# Patient Record
Sex: Male | Born: 1985
Health system: Southern US, Community
[De-identification: ages and names within clinical notes are randomized; demographics above are authoritative.]

## PROBLEM LIST (undated history)

## (undated) DIAGNOSIS — N12 Tubulo-interstitial nephritis, not specified as acute or chronic: Secondary | ICD-10-CM

## (undated) DIAGNOSIS — Z6830 Body mass index (BMI) 30.0-30.9, adult: Secondary | ICD-10-CM

## (undated) HISTORY — DX: Tubulo-interstitial nephritis, not specified as acute or chronic: N12

## (undated) HISTORY — PX: ELBOW ARTHROSCOPY: SHX614

## (undated) HISTORY — DX: Body mass index (BMI) 30.0-30.9, adult: Z68.30

## (undated) HISTORY — PX: WISDOM TOOTH EXTRACTION: SHX21

---
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
## 2016-07-22 DIAGNOSIS — N12 Tubulo-interstitial nephritis, not specified as acute or chronic: Secondary | ICD-10-CM

## 2016-07-22 HISTORY — DX: Tubulo-interstitial nephritis, not specified as acute or chronic: N12

## 2017-08-19 ENCOUNTER — Encounter: Payer: Self-pay | Admitting: Family Medicine

## 2017-08-26 ENCOUNTER — Encounter: Payer: Self-pay | Admitting: Family Medicine

## 2017-12-08 ENCOUNTER — Encounter: Payer: Self-pay | Admitting: Family Medicine

## 2017-12-08 ENCOUNTER — Ambulatory Visit (INDEPENDENT_AMBULATORY_CARE_PROVIDER_SITE_OTHER): Payer: 59 | Admitting: Family Medicine

## 2017-12-08 DIAGNOSIS — J302 Other seasonal allergic rhinitis: Secondary | ICD-10-CM

## 2017-12-08 DIAGNOSIS — R21 Rash and other nonspecific skin eruption: Secondary | ICD-10-CM | POA: Diagnosis not present

## 2017-12-08 DIAGNOSIS — Z87448 Personal history of other diseases of urinary system: Secondary | ICD-10-CM | POA: Diagnosis not present

## 2017-12-08 LAB — BASIC METABOLIC PANEL
BUN: 13 mg/dL (ref 6–23)
CO2: 26 mEq/L (ref 19–32)
Calcium: 9.4 mg/dL (ref 8.4–10.5)
Chloride: 104 mEq/L (ref 96–112)
Creatinine, Ser: 1.1 mg/dL (ref 0.40–1.50)
GFR: 81.43 mL/min (ref >60.00)
Glucose, Bld: 92 mg/dL (ref 70–99)
Potassium: 4 mEq/L (ref 3.5–5.1)
Sodium: 138 mEq/L (ref 135–145)

## 2017-12-08 NOTE — Assessment & Plan Note (Signed)
Continue over-the-counter Zyrtec and Flonase.

## 2017-12-08 NOTE — Assessment & Plan Note (Signed)
Check BMET 

## 2017-12-08 NOTE — Progress Notes (Signed)
Subjective:  Juan Allen is a 32 y.o. male who presents today with a chief complaint of history of interstitial nephritis and to establish care.   HPI:  History of Interstitial nephritis Pt diagnosed with interstitial nephritis about 4 months ago. Was initally found to have elevated creatinine in setting of bilateral flank pain. Pt was admitted and had significant work up including biopsy, ultrasonagraphy, and blood work which was essentially negative except for small eosinophils on his biopsy. No clear precipitant, however pt was noted to take a small dose of ibuprofen and acycylovir just prior to his presentation. Pt was placed on 4 weeks of steroids and has had near resolution of symptoms. Requests CMET today.   Seasonal allergies, chronic problem Several year history. Takes zyrtec as needed. Has been on allergy shots in the past and tolerated well. Occasionally takes flonase.  Rash, chronic problem Several year history. Located on hands and feet. "Blister like". No other symptoms. No pruritis or pain. Has tried topical steroid a couple times which has not helped. Symptoms are stable.   ROS: Per HPI, otherwise a complete review of systems was negative.   PMH:  The following were reviewed and entered/updated in epic: Past Medical History:  Diagnosis Date  . Interstitial nephritis    Patient Active Problem List   Diagnosis Date Noted  . History of interstitial nephritis 12/08/2017  . Seasonal allergies 12/08/2017  . Rash 12/08/2017   Past Surgical History:  Procedure Laterality Date  . ELBOW ARTHROSCOPY Left    medial epidondyle injury  . SURGERY SCROTAL / TESTICULAR  2001   scrotal laceration needed 150 stitches    Family History  Problem Relation Age of Onset  . Depression Mother   . Cancer Maternal Grandfather        bladder cancer  . Diabetes Maternal Grandfather   . Heart disease Maternal Grandfather   . Dementia Paternal Grandmother   . Hypertension  Paternal Grandfather     Medications- reviewed and updated No current outpatient medications on file.   No current facility-administered medications for this visit.    Allergies-reviewed and updated Allergies  Allergen Reactions  . Nsaids     Social History   Socioeconomic History  . Marital status: Married    Spouse name: Not on file  . Number of children: Not on file  . Years of education: Not on file  . Highest education level: Not on file  Occupational History  . Not on file  Social Needs  . Financial resource strain: Not on file  . Food insecurity:    Worry: Not on file    Inability: Not on file  . Transportation needs:    Medical: Not on file    Non-medical: Not on file  Tobacco Use  . Smoking status: Former Smoker    Types: Cigarettes  . Smokeless tobacco: Never Used  Substance and Sexual Activity  . Alcohol use: Yes    Alcohol/week: 3.6 oz    Types: 6 Standard drinks or equivalent per week  . Drug use: Never  . Sexual activity: Yes    Partners: Female  Lifestyle  . Physical activity:    Days per week: Not on file    Minutes per session: Not on file  . Stress: Not on file  Relationships  . Social connections:    Talks on phone: Not on file    Gets together: Not on file    Attends religious service: Not on file  Active member of club or organization: Not on file    Attends meetings of clubs or organizations: Not on file    Relationship status: Not on file  Other Topics Concern  . Not on file  Social History Narrative  . Not on file   Objective:  Physical Exam: BP 104/64   Pulse 67   Temp 98.7 F (37.1 C) (Oral)   Resp 14   Ht 6' (1.829 m)   Wt 213 lb (96.6 kg)   SpO2 96%   BMI 28.89 kg/m   Gen: NAD, resting comfortably CV: RRR with no murmurs appreciated Pulm: NWOB, CTAB with no crackles, wheezes, or rhonchi GI: Normal bowel sounds present. Soft, Nontender, Nondistended. MSK: No edema, cyanosis, or clubbing noted Skin:  Erythematous, macerated, vesicular lesion noted on lateral aspect of soles and palms. Neuro: Grossly normal, moves all extremities Psych: Normal affect and thought content  Assessment/Plan:  History of interstitial nephritis Check BMET.  Seasonal allergies Continue over-the-counter Zyrtec and Flonase.  Rash Possibly consistent with dyshidrotic eczema.  Discussed treatment options including topical steroids.  Patient has some at home that he will possibly try.  Preventive healthcare Obtain records from previous PCP.  Follow-up sooner for CPE in about 6 months.  [name]. Jerline Pain, MD 12/08/2017 2:00 PM

## 2017-12-08 NOTE — Progress Notes (Signed)
Please let pt know that his blood work was all normal.  [name]. Jerline Pain, MD 12/08/2017 4:47 PM

## 2017-12-08 NOTE — Assessment & Plan Note (Signed)
Possibly consistent with dyshidrotic eczema.  Discussed treatment options including topical steroids.  Patient has some at home that he will possibly try.

## 2017-12-08 NOTE — Patient Instructions (Signed)
We will check blood work.  I would like to get your prior records.  Let me know if you need a referral to the nephrologist or allergist.  Please try using steroid cream on your foot rash.  Come back to see me in 6 months for your physical, or sooner as needed.  Take care, Dr Jerline Pain

## 2018-04-01 ENCOUNTER — Other Ambulatory Visit: Payer: Self-pay | Admitting: Family Medicine

## 2018-04-01 DIAGNOSIS — J302 Other seasonal allergic rhinitis: Secondary | ICD-10-CM

## 2018-04-09 ENCOUNTER — Encounter: Payer: Self-pay | Admitting: Sports Medicine

## 2018-04-09 ENCOUNTER — Ambulatory Visit (INDEPENDENT_AMBULATORY_CARE_PROVIDER_SITE_OTHER): Payer: 59 | Admitting: Sports Medicine

## 2018-04-09 VITALS — BP 120/82 | HR 62 | Ht 72.0 in | Wt 205.8 lb

## 2018-04-09 DIAGNOSIS — M6752 Plica syndrome, left knee: Secondary | ICD-10-CM | POA: Diagnosis not present

## 2018-04-09 MED ORDER — DICLOFENAC SODIUM 2 % TD SOLN: 1.0000 "application " | Freq: Two times a day (BID) | TRANSDERMAL | 0 refills | Status: AC

## 2018-04-09 MED ORDER — DICLOFENAC SODIUM 2 % TD SOLN: 1.0000 "application " | Freq: Two times a day (BID) | TRANSDERMAL | 2 refills | Status: DC

## 2018-04-09 NOTE — Patient Instructions (Addendum)
Also check out [employer]" which is a program developed by Dr. Minerva Ends.   There are links to a couple of his YouTube Videos below and I would like to see performing one of his videos 5-6 days per week.    A good intro video is: "Independence from Pain 7-minute Video" - [email or web address]   Exercises that focus more on the neck are as below: Dr. Archie Balboa with [employer] teaching neck and shoulder details Part 1 - https://youtu.be/[email or web address] Part 2 Dr. Archie Balboa with [facility] quick routine to practice daily - https://youtu.be/[email or web address]  Do not try to attempt the entire video when first beginning.    Try breaking of each exercise that he goes into shorter segments.  Otherwise if they perform an exercise for 45 seconds, start with 15 seconds and rest and then resume when they begin the new activity.  If you work your way up to being able to do these videos without having to stop, I expect you will see significant improvements in your pain.  If you enjoy his videos and would like to find out more you can look on his website: [email or web address]  He has a workout streaming option as well as a DVD set available for purchase.  Amazon has the best price for his DVDs.     Pennsaid instructions: You have been given a sample/prescription for Pennsaid, a topical medication.  Please use this twice daily for the next 2-4 weeks.   You are to apply this gel to your injured body part twice daily (morning and evening).   A little goes a long way so you can use about a pea-sized amount for each area.   Spread this small amount over the area into a thin film and let it dry.   Be sure that you do not rub the gel into your skin for more than 10 or 15 seconds otherwise it can irritate you skin.    Once you apply the gel, please do not put any other lotion or clothing in contact with that area for 30 minutes to allow the gel to absorb into your  skin.   Some people are sensitive to the medication and can develop a sunburn-like rash.  If you have only mild symptoms it is okay to continue to use the medication but if you have any breakdown of your skin you should discontinue its use and please let us know.   If you have been written a prescription for Pennsaid, you will receive a pump bottle of this topical gel through a mail order pharmacy.  The instructions on the bottle will say to apply two pumps twice a day which may be too much gel for your particular area so use the pea-sized amount as your guide.

## 2018-04-09 NOTE — Progress Notes (Signed)
Juan Allen. Juan Allen, Juan Allen  Juan Allen - 32 y.o. male MRN [id number]  Date of birth: [birth year]  Visit Date: 04/09/2018  PCP: Juan Barrack, MD   Referred by: Juan Barrack, MD   Scribe(s) for today's visit: Juan Allen, LAT, ATC  SUBJECTIVE:  Juan Allen is here for No chief complaint on file.    HPI: His L lateral knee pain symptoms INITIALLY: Began about a month ago w/ no known MOI Described as moderate burning pain that starts at one Allen and radiates to the top and bottom of the patella w/ irritating activities, nonradiating Worsened with loaded knee flexion Improved with rest Additional associated symptoms include: no L knee swelling, no mechanical symptoms and no N/t noted in his L LE    At this time symptoms show no change compared to onset. He has been doing nothing for his symptoms in terms of treatment.  REVIEW OF SYSTEMS: Denies night time disturbances. Denies fevers, chills, or night sweats. Denies unexplained weight loss. Denies personal history of cancer. Denies changes in bowel or bladder habits. Denies recent unreported falls. Denies new or worsening dyspnea or wheezing. Denies headaches or dizziness.  Denies numbness, tingling or weakness  In the extremities.  Denies dizziness or presyncopal episodes Denies lower extremity edema    HISTORY:  Prior history reviewed and updated per electronic medical record.  Social History   Occupational History  . Not on file  Tobacco Use  . Smoking status: Former Smoker    Types: Cigarettes  . Smokeless tobacco: Never Used  Substance and Sexual Activity  . Alcohol use: Yes    Alcohol/week: 6.0 standard drinks    Types: 6 Standard drinks or equivalent per week  . Drug use: Never  . Sexual activity: Yes    Partners: Female   Social History   Social History Narrative  . Not on file    Past Medical History:    Diagnosis Date  . Interstitial nephritis    Past Surgical History:  Procedure Laterality Date  . ELBOW ARTHROSCOPY Left    medial epidondyle injury  . SURGERY SCROTAL / TESTICULAR  2001   scrotal laceration needed 150 stitches   family history includes Cancer in his maternal grandfather; Dementia in his paternal grandmother; Depression in his mother; Diabetes in his maternal grandfather; Heart disease in his maternal grandfather; Hypertension in his paternal grandfather.  DATA OBTAINED & REVIEWED:  No results for input(s): HGBA1C, LABURIC, CREATINE in the last 8760 hours. .   OBJECTIVE:  VS:  HT:6' (182.9 cm)   WT:205 lb 12.8 oz (93.4 kg)  BMI:27.91    BP:120/82  HR:62bpm  TEMP: ( )  RESP:97 %   PHYSICAL EXAM: CONSTITUTIONAL: Well-developed, Well-nourished and In no acute distress PSYCHIATRIC: Alert & appropriately interactive. and Not depressed or anxious appearing. RESPIRATORY: No increased work of breathing and Trachea Midline EYES: Pupils are equal., EOM intact without nystagmus. and No scleral icterus.  VASCULAR EXAM: Warm and well perfused NEURO: unremarkable  MSK Exam:  Left Knee  Alignment & Contours: normal Skin: Small amount of scaling and dry skin over the anterior aspect of the knee Effusion: none   Generalized Synovitis: none Knee Tenderness: No focal bony TTP Gait: normal Patellar grind produces: No pain and No crepitation  He does have a slightly palpable prominence of the anterior lateral plica and with flexion extension this does reproduce the snapping  over the plica with no significant pain.    RANGE OF MOTION & STRENGTH  EXTENSION: Normal  with no pain.   Strength: Normal FLEXION: Normal with no pain.   Strength: Normal HIP ABDUCTION: Strength: 4/5 Recruitment pattern: TFL predominant   LIGAMENTOUS TESTING  Varus & Valgus Strain: stable to testing Anterior & Posterior Drawer: stable to testing Lachman's: stable to testing   SPECIALITY  TESTING:  Patellar Apprehension: normal, no pain J Sign: normal, no pain Mcmurray's: normal, no pain Thessaly: normal, no pain    ASSESSMENT   1. Plica of knee, left     PLAN:  Pertinent additional documentation may be included in corresponding procedure notes, imaging studies, problem based documentation and patient instructions.  Procedures:  . None  Medications:  No orders of the defined types were placed in this encounter.  Discussion/Instructions: No problem-specific Assessment & Plan notes found for this encounter.  . Symptoms are consistent with lateral plica inflammation.  Conservative measures and topical anti-inflammatory provided. . Links to [employer] provided today per Patient Instructions.  These exercises were developed by Juan Allen, DC with a strong emphasis on core neuromuscular reducation and postural realignment through body-weight exercises. . Discussed red flag symptoms that warrant earlier emergent evaluation and patient voices understanding. . Activity modifications and the importance of avoiding exacerbating activities (limiting pain to no more than a 4 / 10 during or following activity) recommended and discussed.  Follow-up:  . No follow-ups on file.   . If any lack of improvement consider: further diagnostic evaluation with X-rays and MSK ultrasound if persistent symptoms  .      CMA/ATC served as Education administrator during this visit. History, Physical, and Plan performed by medical provider. Documentation and orders reviewed and attested to.      Juan Allen, [facility] Physician

## 2018-05-08 ENCOUNTER — Encounter: Payer: Self-pay | Admitting: Family Medicine

## 2018-05-12 ENCOUNTER — Ambulatory Visit (INDEPENDENT_AMBULATORY_CARE_PROVIDER_SITE_OTHER): Payer: 59 | Admitting: Orthopaedic Surgery

## 2018-05-12 ENCOUNTER — Ambulatory Visit (INDEPENDENT_AMBULATORY_CARE_PROVIDER_SITE_OTHER): Payer: 59

## 2018-05-12 ENCOUNTER — Encounter (INDEPENDENT_AMBULATORY_CARE_PROVIDER_SITE_OTHER): Payer: Self-pay | Admitting: Orthopaedic Surgery

## 2018-05-12 DIAGNOSIS — M25562 Pain in left knee: Secondary | ICD-10-CM | POA: Diagnosis not present

## 2018-05-12 NOTE — Progress Notes (Signed)
Office Visit Note   Patient: Juan Allen           Date of Birth: 1985/08/08           MRN: [id number] Visit Date: 05/12/2018              Requested by: Vivi Barrack, MD [address] [place], [place] [address] PCP: Vivi Barrack, MD   Assessment & Plan: Visit Diagnoses:  1. Acute pain of left knee     Plan: Since the last few days she is been feeling better overall regular continue just watch his knee.  He will still work on activity modification.  However my next step would be an intra-articular steroid injection to treat inflammation going on likely intra-articular.  This could both be therapeutic and diagnostic injection.  We would even consider Neurontin at bedtime.  All questions concerns were answered and addressed.  If he decides to have an injection he will give [place] a call and we can bring him in easily for the left knee steroid injection.  Follow-Up Instructions: Return if symptoms worsen or fail to improve.   Orders:  Orders Placed This Encounter  Procedures  . XR Knee 1-2 Views Left   No orders of the defined types were placed in this encounter.     Procedures: No procedures performed   Clinical Data: No additional findings.   Subjective: Chief Complaint  Patient presents with  . Left Knee - Pain  The patient is very pleasant and active 32 year old gentleman who comes in for evaluation treatment of acute left knee pain is been going on for about 6 weeks now.  There is actually a burning sensation in his knee as well when kneeling and is mainly lateral at the lateral joint line.  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  His wife is a physician is with him today.  He cannot take anti-inflammatories orally due to kidney issues.  He has been trying a topical anti-inflammatory but is not helped significantly.  He said the last few days has been doing much better overall but is been staying off of his knee.  He denies any  locking catching and denies any swelling.  He has not injured this knee before either.  HPI  Review of Systems He currently denies any systemic illnesses  Objective: Vital Signs: There were no vitals taken for this visit.  Physical Exam He is alert and oriented x3 in no acute distress Ortho Exam Examination of his left knee shows no effusion.  His range of motion is full.  His alignment is normal.  His Lachman's and McMurray's exams are negative.  He has slight lateral joint line tenderness.  I cannot palpate any deficits or plica around the knee.  His skin is intact as well. Specialty Comments:  No specialty comments available.  Imaging: Xr Knee 1-2 Views Left  Result Date: 05/12/2018 2 views of the left knee show normal alignment with well-maintained joint space no acute findings.  There is no effusion.    PMFS History: Patient Active Problem List   Diagnosis Date Noted  . Plica of knee, left 03/70/4888  . History of interstitial nephritis 12/08/2017  . Seasonal allergies 12/08/2017  . Rash 12/08/2017   Past Medical History:  Diagnosis Date  . Interstitial nephritis     Family History  Problem Relation Age of Onset  . Depression Mother   . Cancer Maternal Grandfather  bladder cancer  . Diabetes Maternal Grandfather   . Heart disease Maternal Grandfather   . Dementia Paternal Grandmother   . Hypertension Paternal Grandfather     Past Surgical History:  Procedure Laterality Date  . ELBOW ARTHROSCOPY Left    medial epidondyle injury  . SURGERY SCROTAL / TESTICULAR  2001   scrotal laceration needed 150 stitches   Social History   Occupational History  . Not on file  Tobacco Use  . Smoking status: Former Smoker    Types: Cigarettes  . Smokeless tobacco: Never Used  Substance and Sexual Activity  . Alcohol use: Yes    Alcohol/week: 6.0 standard drinks    Types: 6 Standard drinks or equivalent per week  . Drug use: Never  . Sexual activity: Yes     Partners: Female

## 2018-05-18 ENCOUNTER — Other Ambulatory Visit: Payer: Self-pay

## 2018-05-18 DIAGNOSIS — J301 Allergic rhinitis due to pollen: Secondary | ICD-10-CM | POA: Diagnosis not present

## 2018-05-18 DIAGNOSIS — J3081 Allergic rhinitis due to animal (cat) (dog) hair and dander: Secondary | ICD-10-CM | POA: Diagnosis not present

## 2018-05-18 DIAGNOSIS — Z23 Encounter for immunization: Secondary | ICD-10-CM

## 2018-05-18 DIAGNOSIS — J3089 Other allergic rhinitis: Secondary | ICD-10-CM | POA: Diagnosis not present

## 2018-05-22 DIAGNOSIS — J3081 Allergic rhinitis due to animal (cat) (dog) hair and dander: Secondary | ICD-10-CM | POA: Diagnosis not present

## 2018-05-22 DIAGNOSIS — J301 Allergic rhinitis due to pollen: Secondary | ICD-10-CM | POA: Diagnosis not present

## 2018-05-25 DIAGNOSIS — J3089 Other allergic rhinitis: Secondary | ICD-10-CM | POA: Diagnosis not present

## 2018-07-16 DIAGNOSIS — J3081 Allergic rhinitis due to animal (cat) (dog) hair and dander: Secondary | ICD-10-CM | POA: Diagnosis not present

## 2018-07-16 DIAGNOSIS — J301 Allergic rhinitis due to pollen: Secondary | ICD-10-CM | POA: Diagnosis not present

## 2018-07-16 DIAGNOSIS — J3089 Other allergic rhinitis: Secondary | ICD-10-CM | POA: Diagnosis not present

## 2018-07-28 DIAGNOSIS — J3081 Allergic rhinitis due to animal (cat) (dog) hair and dander: Secondary | ICD-10-CM | POA: Diagnosis not present

## 2018-07-28 DIAGNOSIS — J301 Allergic rhinitis due to pollen: Secondary | ICD-10-CM | POA: Diagnosis not present

## 2018-07-28 DIAGNOSIS — J3089 Other allergic rhinitis: Secondary | ICD-10-CM | POA: Diagnosis not present

## 2018-07-31 DIAGNOSIS — J3089 Other allergic rhinitis: Secondary | ICD-10-CM | POA: Diagnosis not present

## 2018-07-31 DIAGNOSIS — J301 Allergic rhinitis due to pollen: Secondary | ICD-10-CM | POA: Diagnosis not present

## 2018-07-31 DIAGNOSIS — J3081 Allergic rhinitis due to animal (cat) (dog) hair and dander: Secondary | ICD-10-CM | POA: Diagnosis not present

## 2018-08-07 ENCOUNTER — Ambulatory Visit (INDEPENDENT_AMBULATORY_CARE_PROVIDER_SITE_OTHER): Payer: 59 | Admitting: Family Medicine

## 2018-08-07 ENCOUNTER — Encounter: Payer: Self-pay | Admitting: Family Medicine

## 2018-08-07 VITALS — BP 122/72 | HR 65 | Temp 98.4°F | Ht 72.0 in | Wt 212.2 lb

## 2018-08-07 DIAGNOSIS — Z0001 Encounter for general adult medical examination with abnormal findings: Secondary | ICD-10-CM | POA: Diagnosis not present

## 2018-08-07 DIAGNOSIS — Z87448 Personal history of other diseases of urinary system: Secondary | ICD-10-CM | POA: Diagnosis not present

## 2018-08-07 DIAGNOSIS — Z6828 Body mass index (BMI) 28.0-28.9, adult: Secondary | ICD-10-CM | POA: Diagnosis not present

## 2018-08-07 DIAGNOSIS — Z13220 Encounter for screening for lipoid disorders: Secondary | ICD-10-CM | POA: Diagnosis not present

## 2018-08-07 LAB — COMPREHENSIVE METABOLIC PANEL
ALT: 23 U/L (ref 0–53)
AST: 18 U/L (ref 0–37)
Albumin: 4.6 g/dL (ref 3.5–5.2)
Alkaline Phosphatase: 89 U/L (ref 39–117)
BUN: 11 mg/dL (ref 6–23)
CO2: 28 mEq/L (ref 19–32)
Calcium: 9.8 mg/dL (ref 8.4–10.5)
Chloride: 102 mEq/L (ref 96–112)
Creatinine, Ser: 1 mg/dL (ref 0.40–1.50)
GFR: 85.18 mL/min (ref >60.00)
Glucose, Bld: 89 mg/dL (ref 70–99)
Potassium: 4.5 mEq/L (ref 3.5–5.1)
Sodium: 138 mEq/L (ref 135–145)
Total Bilirubin: 0.8 mg/dL (ref 0.2–1.2)
Total Protein: 7 g/dL (ref 6.0–8.3)

## 2018-08-07 LAB — LIPID PANEL
Cholesterol: 184 mg/dL (ref 0–200)
HDL: 40.9 mg/dL (ref >39.00)
LDL Cholesterol: 117 mg/dL — ABNORMAL HIGH (ref 0–99)
NonHDL: 142.76
Total CHOL/HDL Ratio: 4
Triglycerides: 128 mg/dL (ref 0.0–149.0)
VLDL: 25.6 mg/dL (ref 0.0–40.0)

## 2018-08-07 LAB — CBC
HCT: 46.6 % (ref 39.0–52.0)
Hemoglobin: 16.5 g/dL (ref 13.0–17.0)
MCHC: 35.5 g/dL (ref 30.0–36.0)
MCV: 90.5 fl (ref 78.0–100.0)
Platelets: 324 10*3/uL (ref 150.0–400.0)
RBC: 5.15 Mil/uL (ref 4.22–5.81)
RDW: 11.6 % (ref 11.5–15.5)
WBC: 5 10*3/uL (ref 4.0–10.5)

## 2018-08-07 LAB — TSH: TSH: 0.95 u[IU]/mL (ref 0.35–4.50)

## 2018-08-07 NOTE — Patient Instructions (Signed)
It was very nice to see you today!   Eat at least 3 REAL meals and 1-2 snacks per day.  Aim for no more than 5 hours between eating.  Eat breakfast within one hour of getting up.    Obtain twice as many fruits/vegetables as protein or carbohydrate foods for both lunch and dinner.   Cut down on sweet beverages. This includes juice, soda, and sweet tea.    Exercise at least 150 minutes every week.    Take care, Dr Jerline Pain   Preventive Care 18-39 Years, Male Preventive care refers to lifestyle choices and visits with your health care provider that can promote health and wellness. What does preventive care include?   A yearly physical exam. This is also called an annual well check.  Dental exams once or twice a year.  Routine eye exams. Ask your health care provider how often you should have your eyes checked.  Personal lifestyle choices, including: ? Daily care of your teeth and gums. ? Regular physical activity. ? Eating a healthy diet. ? Avoiding tobacco and drug use. ? Limiting alcohol use. ? Practicing safe sex. What happens during an annual well check? The services and screenings done by your health care provider during your annual well check will depend on your age, overall health, lifestyle risk factors, and family history of disease. Counseling Your health care provider may ask you questions about your:  Alcohol use.  Tobacco use.  Drug use.  Emotional well-being.  Home and relationship well-being.  Sexual activity.  Eating habits.  Work and work Statistician. Screening You may have the following tests or measurements:  Height, weight, and BMI.  Blood pressure.  Lipid and cholesterol levels. These may be checked every 5 years starting at age 47.  Diabetes screening. This is done by checking your blood sugar (glucose) after you have not eaten for a while (fasting).  Skin check.  Hepatitis C blood test.  Hepatitis B blood test.  Sexually  transmitted disease (STD) testing. Discuss your test results, treatment options, and if necessary, the need for more tests with your health care provider. Vaccines Your health care provider may recommend certain vaccines, such as:  Influenza vaccine. This is recommended every year.  Tetanus, diphtheria, and acellular pertussis (Tdap, Td) vaccine. You may need a Td booster every 10 years.  Varicella vaccine. You may need this if you have not been vaccinated.  HPV vaccine. If you are 64 or younger, you may need three doses over 6 months.  Measles, mumps, and rubella (MMR) vaccine. You may need at least one dose of MMR.You may also need a second dose.  Pneumococcal 13-valent conjugate (PCV13) vaccine. You may need this if you have certain conditions and have not been vaccinated.  Pneumococcal polysaccharide (PPSV23) vaccine. You may need one or two doses if you smoke cigarettes or if you have certain conditions.  Meningococcal vaccine. One dose is recommended if you are age 9-21 years and a first-year college student living in a residence hall, or if you have one of several medical conditions. You may also need additional booster doses.  Hepatitis A vaccine. You may need this if you have certain conditions or if you travel or work in places where you may be exposed to hepatitis A.  Hepatitis B vaccine. You may need this if you have certain conditions or if you travel or work in places where you may be exposed to hepatitis B.  Haemophilus influenzae type b (Hib) vaccine. You  may need this if you have certain risk factors. Talk to your health care provider about which screenings and vaccines you need and how often you need them. This information is not intended to replace advice given to you by your health care provider. Make sure you discuss any questions you have with your health care provider. Document Released: 09/03/2001 Document Revised: 02/18/2017 Document Reviewed:  05/09/2015 Elsevier Interactive Patient Education  2019 [employer].

## 2018-08-07 NOTE — Assessment & Plan Note (Signed)
Check CBC, CMET, and TSH.

## 2018-08-07 NOTE — Progress Notes (Signed)
Subjective:  Juan Allen is a 33 y.o. male who presents today for his annual comprehensive physical exam.    HPI:  He has no acute complaints today.   Lifestyle Diet: No specific diets.  Exercise: Not going to gym regularly. Active work  Depression screen [facility] 2/9 08/07/2018  Decreased Interest 0  Down, Depressed, Hopeless 0  PHQ - 2 Score 0    Health Maintenance Due  Topic Date Due  . HIV Screening  11/26/1998  . TETANUS/TDAP  11/26/2002    ROS: Mild cough and nasal congestion for 2 weeks, otherwise a complete review of systems was negative.   PMH:  The following were reviewed and entered/updated in epic: Past Medical History:  Diagnosis Date  . Interstitial nephritis    Patient Active Problem List   Diagnosis Date Noted  . Plica of knee, left 78/29/5621  . History of interstitial nephritis 12/08/2017  . Seasonal allergies 12/08/2017  . Rash 12/08/2017   Past Surgical History:  Procedure Laterality Date  . ELBOW ARTHROSCOPY Left    medial epidondyle injury  . SURGERY SCROTAL / TESTICULAR  2001   scrotal laceration needed 150 stitches    Family History  Problem Relation Age of Onset  . Depression Mother   . Cancer Maternal Grandfather        bladder cancer  . Diabetes Maternal Grandfather   . Heart disease Maternal Grandfather   . Dementia Paternal Grandmother   . Hypertension Paternal Grandfather     Medications- reviewed and updated Current Outpatient Medications  Medication Sig Dispense Refill  . Diclofenac Sodium (PENNSAID) 2 % SOLN Place 1 application onto the skin 2 (two) times daily. 112 g 2   No current facility-administered medications for this visit.     Allergies-reviewed and updated Allergies  Allergen Reactions  . Nsaids     Social History   Socioeconomic History  . Marital status: Married    Spouse name: Not on file  . Number of children: Not on file  . Years of education: Not on file  . Highest education level: Not  on file  Occupational History  . Not on file  Social Needs  . Financial resource strain: Not on file  . Food insecurity:    Worry: Not on file    Inability: Not on file  . Transportation needs:    Medical: Not on file    Non-medical: Not on file  Tobacco Use  . Smoking status: Former Smoker    Types: Cigarettes  . Smokeless tobacco: Never Used  Substance and Sexual Activity  . Alcohol use: Yes    Alcohol/week: 6.0 standard drinks    Types: 6 Standard drinks or equivalent per week  . Drug use: Never  . Sexual activity: Yes    Partners: Female  Lifestyle  . Physical activity:    Days per week: Not on file    Minutes per session: Not on file  . Stress: Not on file  Relationships  . Social connections:    Talks on phone: Not on file    Gets together: Not on file    Attends religious service: Not on file    Active member of club or organization: Not on file    Attends meetings of clubs or organizations: Not on file    Relationship status: Not on file  Other Topics Concern  . Not on file  Social History Narrative  . Not on file    Objective:  Physical  Exam: BP 122/72 (BP Location: Left Arm, Patient Position: Sitting, Cuff Size: Normal)   Pulse 65   Temp 98.4 F (36.9 C) (Oral)   Ht 6' (1.829 m)   Wt 212 lb 3.2 oz (96.3 kg)   SpO2 97%   BMI 28.78 kg/m   Body mass index is 28.78 kg/m. Wt Readings from Last 3 Encounters:  08/07/18 212 lb 3.2 oz (96.3 kg)  04/09/18 205 lb 12.8 oz (93.4 kg)  12/08/17 213 lb (96.6 kg)   Gen: NAD, resting comfortably HEENT: TMs normal bilaterally. OP clear. No thyromegaly noted.  CV: RRR with no murmurs appreciated Pulm: NWOB, CTAB with no crackles, wheezes, or rhonchi GI: Normal bowel sounds present. Soft, Nontender, Nondistended. MSK: no edema, cyanosis, or clubbing noted Skin: warm, dry Neuro: CN2-12 grossly intact. Strength 5/5 in upper and lower extremities. Reflexes symmetric and intact bilaterally.  Psych: Normal affect  and thought content  Assessment/Plan:  History of interstitial nephritis Check CBC, CMET, and TSH.   Preventative Healthcare: Check lipid panel.  Patient Counseling(The following topics were reviewed and/or handout was given):  -Nutrition: Stressed importance of moderation in sodium/caffeine intake, saturated fat and cholesterol, caloric balance, sufficient intake of fresh fruits, vegetables, and fiber.  -Stressed the importance of regular exercise.   -Substance Abuse: Discussed cessation/primary prevention of tobacco, alcohol, or other drug use; driving or other dangerous activities under the influence; availability of treatment for abuse.   -Injury prevention: Discussed safety belts, safety helmets, smoke detector, smoking near bedding or upholstery.   -Sexuality: Discussed sexually transmitted diseases, partner selection, use of condoms, avoidance of unintended pregnancy and contraceptive alternatives.   -Dental health: Discussed importance of regular tooth brushing, flossing, and dental visits.  -Health maintenance and immunizations reviewed. Please refer to Health maintenance section.  Return to care in 1 year for next preventative visit.   [name]. Jerline Pain, MD 08/07/2018 8:43 AM

## 2018-08-10 NOTE — Progress Notes (Signed)
Please inform patient of the following:  His "bad" cholesterol was up just a little but everthing else was normal. Does not need any meds. Continue working on diet and exercise.  Juan Allen. Jerline Pain, MD 08/10/2018 5:33 PM

## 2018-08-19 DIAGNOSIS — J301 Allergic rhinitis due to pollen: Secondary | ICD-10-CM | POA: Diagnosis not present

## 2018-08-19 DIAGNOSIS — J3081 Allergic rhinitis due to animal (cat) (dog) hair and dander: Secondary | ICD-10-CM | POA: Diagnosis not present

## 2018-08-19 DIAGNOSIS — J3089 Other allergic rhinitis: Secondary | ICD-10-CM | POA: Diagnosis not present

## 2018-09-01 DIAGNOSIS — J3089 Other allergic rhinitis: Secondary | ICD-10-CM | POA: Diagnosis not present

## 2018-09-01 DIAGNOSIS — J301 Allergic rhinitis due to pollen: Secondary | ICD-10-CM | POA: Diagnosis not present

## 2018-09-01 DIAGNOSIS — J3081 Allergic rhinitis due to animal (cat) (dog) hair and dander: Secondary | ICD-10-CM | POA: Diagnosis not present

## 2018-09-07 ENCOUNTER — Other Ambulatory Visit: Payer: Self-pay | Admitting: Family Medicine

## 2018-09-07 MED ORDER — BENZONATATE 200 MG PO CAPS: 200.0000 mg | ORAL_CAPSULE | Freq: Three times a day (TID) | ORAL | 0 refills | Status: DC | PRN

## 2018-09-07 MED FILL — BENZONATATE 200 MG CAPS: 200 | 7 days supply | Qty: 20 | Fill #0

## 2018-09-15 DIAGNOSIS — J3089 Other allergic rhinitis: Secondary | ICD-10-CM | POA: Diagnosis not present

## 2018-09-15 DIAGNOSIS — J3081 Allergic rhinitis due to animal (cat) (dog) hair and dander: Secondary | ICD-10-CM | POA: Diagnosis not present

## 2018-09-15 DIAGNOSIS — J301 Allergic rhinitis due to pollen: Secondary | ICD-10-CM | POA: Diagnosis not present

## 2018-09-29 DIAGNOSIS — J3089 Other allergic rhinitis: Secondary | ICD-10-CM | POA: Diagnosis not present

## 2018-09-29 DIAGNOSIS — J301 Allergic rhinitis due to pollen: Secondary | ICD-10-CM | POA: Diagnosis not present

## 2018-09-29 DIAGNOSIS — J3081 Allergic rhinitis due to animal (cat) (dog) hair and dander: Secondary | ICD-10-CM | POA: Diagnosis not present

## 2018-10-01 ENCOUNTER — Other Ambulatory Visit: Payer: Self-pay | Admitting: Family Medicine

## 2018-10-01 DIAGNOSIS — J3089 Other allergic rhinitis: Secondary | ICD-10-CM | POA: Diagnosis not present

## 2018-10-01 DIAGNOSIS — J301 Allergic rhinitis due to pollen: Secondary | ICD-10-CM | POA: Diagnosis not present

## 2018-10-01 DIAGNOSIS — J3081 Allergic rhinitis due to animal (cat) (dog) hair and dander: Secondary | ICD-10-CM | POA: Diagnosis not present

## 2018-10-01 MED ORDER — BENZONATATE 200 MG PO CAPS: 200.0000 mg | ORAL_CAPSULE | Freq: Three times a day (TID) | ORAL | 0 refills | Status: DC | PRN

## 2018-10-02 ENCOUNTER — Ambulatory Visit: Payer: 59 | Admitting: Family Medicine

## 2018-10-08 DIAGNOSIS — J3089 Other allergic rhinitis: Secondary | ICD-10-CM | POA: Diagnosis not present

## 2018-10-08 DIAGNOSIS — J3081 Allergic rhinitis due to animal (cat) (dog) hair and dander: Secondary | ICD-10-CM | POA: Diagnosis not present

## 2018-10-08 DIAGNOSIS — J301 Allergic rhinitis due to pollen: Secondary | ICD-10-CM | POA: Diagnosis not present

## 2018-10-14 DIAGNOSIS — J3089 Other allergic rhinitis: Secondary | ICD-10-CM | POA: Diagnosis not present

## 2018-10-14 DIAGNOSIS — J3081 Allergic rhinitis due to animal (cat) (dog) hair and dander: Secondary | ICD-10-CM | POA: Diagnosis not present

## 2018-10-14 DIAGNOSIS — J301 Allergic rhinitis due to pollen: Secondary | ICD-10-CM | POA: Diagnosis not present

## 2018-10-16 DIAGNOSIS — J301 Allergic rhinitis due to pollen: Secondary | ICD-10-CM | POA: Diagnosis not present

## 2018-10-16 DIAGNOSIS — J3081 Allergic rhinitis due to animal (cat) (dog) hair and dander: Secondary | ICD-10-CM | POA: Diagnosis not present

## 2018-10-16 DIAGNOSIS — J3089 Other allergic rhinitis: Secondary | ICD-10-CM | POA: Diagnosis not present

## 2018-10-22 ENCOUNTER — Ambulatory Visit (INDEPENDENT_AMBULATORY_CARE_PROVIDER_SITE_OTHER): Payer: 59

## 2018-10-22 ENCOUNTER — Encounter: Payer: Self-pay | Admitting: Family Medicine

## 2018-10-22 ENCOUNTER — Ambulatory Visit (INDEPENDENT_AMBULATORY_CARE_PROVIDER_SITE_OTHER): Payer: 59 | Admitting: Family Medicine

## 2018-10-22 ENCOUNTER — Other Ambulatory Visit: Payer: Self-pay

## 2018-10-22 VITALS — BP 136/82 | HR 67 | Temp 98.2°F | Ht 72.0 in | Wt 212.0 lb

## 2018-10-22 DIAGNOSIS — J3089 Other allergic rhinitis: Secondary | ICD-10-CM | POA: Diagnosis not present

## 2018-10-22 DIAGNOSIS — R05 Cough: Secondary | ICD-10-CM

## 2018-10-22 DIAGNOSIS — R059 Cough, unspecified: Secondary | ICD-10-CM

## 2018-10-22 DIAGNOSIS — J3081 Allergic rhinitis due to animal (cat) (dog) hair and dander: Secondary | ICD-10-CM | POA: Diagnosis not present

## 2018-10-22 DIAGNOSIS — J301 Allergic rhinitis due to pollen: Secondary | ICD-10-CM | POA: Diagnosis not present

## 2018-10-22 MED ORDER — AZELASTINE HCL 0.1 % NA SOLN: 2.0000 | Freq: Two times a day (BID) | NASAL | 12 refills | Status: DC

## 2018-10-22 NOTE — Patient Instructions (Signed)
It was very nice to see you today!  I think your cough is most likely coming from allergies.  We could consider trying Singulair or Astelin nasal spray if you wish.  Please let me know if you would like to send any of these in.  Take care, Dr Jerline Pain

## 2018-10-22 NOTE — Progress Notes (Signed)
   Chief Complaint:  Juan Allen is a 33 y.o. male who presents today with a chief complaint of cough.   Assessment/Plan:  Cough No red flags. Likely post infectious vs post nasal drip. His nasal turbinates are very edematous. CXR clear.  Already taking OTC allergy medications. Has not improved with Singulair in the past.  Will start astelin nasal spray. Continue flonase. Consider PFTs or advanced imaging if cough persists for several more weeks despite above.     Subjective:  HPI:  Cough  Started about 3 months ago.  Stable over that time.  Had severe upper respiratory infection about a month ago however cough preceded this.  Does not have any wheeze, shortness of breath, fevers, chills, or myalgias.  No known recent sick contacts.  No known exposures.  Has a history of allergic rhinitis and is currently taking Flonase for this.  No other obvious alleviating or aggravating factors.  ROS: Per HPI  PMH: He reports that he has quit smoking. His smoking use included cigarettes. He has never used smokeless tobacco. He reports current alcohol use of about 6.0 standard drinks of alcohol per week. He reports that he does not use drugs.      Objective:  Physical Exam: BP 136/82 (BP Location: Left Arm, Patient Position: Sitting, Cuff Size: Normal)   Pulse 67   Temp 98.2 F (36.8 C) (Oral)   SpO2 96%   Gen: NAD, resting comfortably HEENT: TMs clear.  Nasal mucosa erythematous and boggy with clear discharge. CV: Regular rate and rhythm with no murmurs appreciated Pulm: Normal work of breathing, clear to auscultation bilaterally with no crackles, wheezes, or rhonchi      Caleb M. Jerline Pain, MD 10/22/2018 8:11 AM

## 2018-10-26 DIAGNOSIS — J3081 Allergic rhinitis due to animal (cat) (dog) hair and dander: Secondary | ICD-10-CM | POA: Diagnosis not present

## 2018-10-26 DIAGNOSIS — J3089 Other allergic rhinitis: Secondary | ICD-10-CM | POA: Diagnosis not present

## 2018-10-26 DIAGNOSIS — J301 Allergic rhinitis due to pollen: Secondary | ICD-10-CM | POA: Diagnosis not present

## 2018-11-02 DIAGNOSIS — J3089 Other allergic rhinitis: Secondary | ICD-10-CM | POA: Diagnosis not present

## 2018-11-02 DIAGNOSIS — J3081 Allergic rhinitis due to animal (cat) (dog) hair and dander: Secondary | ICD-10-CM | POA: Diagnosis not present

## 2018-11-02 DIAGNOSIS — J301 Allergic rhinitis due to pollen: Secondary | ICD-10-CM | POA: Diagnosis not present

## 2018-11-05 DIAGNOSIS — J3089 Other allergic rhinitis: Secondary | ICD-10-CM | POA: Diagnosis not present

## 2018-11-05 DIAGNOSIS — J3081 Allergic rhinitis due to animal (cat) (dog) hair and dander: Secondary | ICD-10-CM | POA: Diagnosis not present

## 2018-11-05 DIAGNOSIS — J301 Allergic rhinitis due to pollen: Secondary | ICD-10-CM | POA: Diagnosis not present

## 2018-11-11 DIAGNOSIS — J301 Allergic rhinitis due to pollen: Secondary | ICD-10-CM | POA: Diagnosis not present

## 2018-11-11 DIAGNOSIS — J3089 Other allergic rhinitis: Secondary | ICD-10-CM | POA: Diagnosis not present

## 2018-11-11 DIAGNOSIS — J3081 Allergic rhinitis due to animal (cat) (dog) hair and dander: Secondary | ICD-10-CM | POA: Diagnosis not present

## 2018-12-10 DIAGNOSIS — J301 Allergic rhinitis due to pollen: Secondary | ICD-10-CM | POA: Diagnosis not present

## 2018-12-10 DIAGNOSIS — J3081 Allergic rhinitis due to animal (cat) (dog) hair and dander: Secondary | ICD-10-CM | POA: Diagnosis not present

## 2018-12-10 DIAGNOSIS — J3089 Other allergic rhinitis: Secondary | ICD-10-CM | POA: Diagnosis not present

## 2019-03-05 ENCOUNTER — Other Ambulatory Visit: Payer: Self-pay | Admitting: Family Medicine

## 2019-03-05 DIAGNOSIS — R059 Cough, unspecified: Secondary | ICD-10-CM

## 2019-03-05 DIAGNOSIS — R05 Cough: Secondary | ICD-10-CM

## 2019-03-05 DIAGNOSIS — Z3009 Encounter for other general counseling and advice on contraception: Secondary | ICD-10-CM

## 2019-03-09 ENCOUNTER — Other Ambulatory Visit: Payer: Self-pay

## 2019-03-09 ENCOUNTER — Other Ambulatory Visit: Payer: 59

## 2019-03-09 DIAGNOSIS — R05 Cough: Secondary | ICD-10-CM

## 2019-03-09 DIAGNOSIS — R059 Cough, unspecified: Secondary | ICD-10-CM

## 2019-03-09 LAB — SARS-COV-2 IGG: SARS-COV-2 IgG: 0

## 2019-03-09 NOTE — Progress Notes (Signed)
Please inform patient of the following:  COVID antibody negative.  [name]. Jerline Pain, MD 03/09/2019 4:41 PM

## 2019-03-16 DIAGNOSIS — J301 Allergic rhinitis due to pollen: Secondary | ICD-10-CM | POA: Diagnosis not present

## 2019-03-16 DIAGNOSIS — J3089 Other allergic rhinitis: Secondary | ICD-10-CM | POA: Diagnosis not present

## 2019-03-16 DIAGNOSIS — J3081 Allergic rhinitis due to animal (cat) (dog) hair and dander: Secondary | ICD-10-CM | POA: Diagnosis not present

## 2019-03-18 DIAGNOSIS — J301 Allergic rhinitis due to pollen: Secondary | ICD-10-CM | POA: Diagnosis not present

## 2019-03-18 DIAGNOSIS — J3081 Allergic rhinitis due to animal (cat) (dog) hair and dander: Secondary | ICD-10-CM | POA: Diagnosis not present

## 2019-03-18 DIAGNOSIS — J3089 Other allergic rhinitis: Secondary | ICD-10-CM | POA: Diagnosis not present

## 2019-03-22 DIAGNOSIS — J3081 Allergic rhinitis due to animal (cat) (dog) hair and dander: Secondary | ICD-10-CM | POA: Diagnosis not present

## 2019-03-22 DIAGNOSIS — J3089 Other allergic rhinitis: Secondary | ICD-10-CM | POA: Diagnosis not present

## 2019-03-22 DIAGNOSIS — J301 Allergic rhinitis due to pollen: Secondary | ICD-10-CM | POA: Diagnosis not present

## 2019-03-30 DIAGNOSIS — J3089 Other allergic rhinitis: Secondary | ICD-10-CM | POA: Diagnosis not present

## 2019-03-30 DIAGNOSIS — J3081 Allergic rhinitis due to animal (cat) (dog) hair and dander: Secondary | ICD-10-CM | POA: Diagnosis not present

## 2019-03-30 DIAGNOSIS — J301 Allergic rhinitis due to pollen: Secondary | ICD-10-CM | POA: Diagnosis not present

## 2019-04-06 DIAGNOSIS — J3089 Other allergic rhinitis: Secondary | ICD-10-CM | POA: Diagnosis not present

## 2019-04-06 DIAGNOSIS — J301 Allergic rhinitis due to pollen: Secondary | ICD-10-CM | POA: Diagnosis not present

## 2019-04-06 DIAGNOSIS — J3081 Allergic rhinitis due to animal (cat) (dog) hair and dander: Secondary | ICD-10-CM | POA: Diagnosis not present

## 2019-04-09 DIAGNOSIS — J301 Allergic rhinitis due to pollen: Secondary | ICD-10-CM | POA: Diagnosis not present

## 2019-04-09 DIAGNOSIS — J3081 Allergic rhinitis due to animal (cat) (dog) hair and dander: Secondary | ICD-10-CM | POA: Diagnosis not present

## 2019-04-09 DIAGNOSIS — J3089 Other allergic rhinitis: Secondary | ICD-10-CM | POA: Diagnosis not present

## 2019-04-13 DIAGNOSIS — J3089 Other allergic rhinitis: Secondary | ICD-10-CM | POA: Diagnosis not present

## 2019-04-13 DIAGNOSIS — J3081 Allergic rhinitis due to animal (cat) (dog) hair and dander: Secondary | ICD-10-CM | POA: Diagnosis not present

## 2019-04-13 DIAGNOSIS — J301 Allergic rhinitis due to pollen: Secondary | ICD-10-CM | POA: Diagnosis not present

## 2019-04-19 DIAGNOSIS — J3081 Allergic rhinitis due to animal (cat) (dog) hair and dander: Secondary | ICD-10-CM | POA: Diagnosis not present

## 2019-04-19 DIAGNOSIS — J301 Allergic rhinitis due to pollen: Secondary | ICD-10-CM | POA: Diagnosis not present

## 2019-04-19 DIAGNOSIS — J3089 Other allergic rhinitis: Secondary | ICD-10-CM | POA: Diagnosis not present

## 2019-04-26 DIAGNOSIS — Z23 Encounter for immunization: Secondary | ICD-10-CM | POA: Diagnosis not present

## 2019-04-26 DIAGNOSIS — J3089 Other allergic rhinitis: Secondary | ICD-10-CM | POA: Diagnosis not present

## 2019-04-26 DIAGNOSIS — J3081 Allergic rhinitis due to animal (cat) (dog) hair and dander: Secondary | ICD-10-CM | POA: Diagnosis not present

## 2019-04-26 DIAGNOSIS — Z3009 Encounter for other general counseling and advice on contraception: Secondary | ICD-10-CM | POA: Diagnosis not present

## 2019-04-26 DIAGNOSIS — J301 Allergic rhinitis due to pollen: Secondary | ICD-10-CM | POA: Diagnosis not present

## 2019-04-30 DIAGNOSIS — J301 Allergic rhinitis due to pollen: Secondary | ICD-10-CM | POA: Diagnosis not present

## 2019-04-30 DIAGNOSIS — J3081 Allergic rhinitis due to animal (cat) (dog) hair and dander: Secondary | ICD-10-CM | POA: Diagnosis not present

## 2019-04-30 DIAGNOSIS — J3089 Other allergic rhinitis: Secondary | ICD-10-CM | POA: Diagnosis not present

## 2019-05-05 DIAGNOSIS — J3081 Allergic rhinitis due to animal (cat) (dog) hair and dander: Secondary | ICD-10-CM | POA: Diagnosis not present

## 2019-05-05 DIAGNOSIS — J3089 Other allergic rhinitis: Secondary | ICD-10-CM | POA: Diagnosis not present

## 2019-05-05 DIAGNOSIS — J301 Allergic rhinitis due to pollen: Secondary | ICD-10-CM | POA: Diagnosis not present

## 2019-05-10 DIAGNOSIS — J3089 Other allergic rhinitis: Secondary | ICD-10-CM | POA: Diagnosis not present

## 2019-05-10 DIAGNOSIS — J301 Allergic rhinitis due to pollen: Secondary | ICD-10-CM | POA: Diagnosis not present

## 2019-05-10 DIAGNOSIS — J3081 Allergic rhinitis due to animal (cat) (dog) hair and dander: Secondary | ICD-10-CM | POA: Diagnosis not present

## 2019-05-18 DIAGNOSIS — J3089 Other allergic rhinitis: Secondary | ICD-10-CM | POA: Diagnosis not present

## 2019-05-18 DIAGNOSIS — J3081 Allergic rhinitis due to animal (cat) (dog) hair and dander: Secondary | ICD-10-CM | POA: Diagnosis not present

## 2019-05-18 DIAGNOSIS — J301 Allergic rhinitis due to pollen: Secondary | ICD-10-CM | POA: Diagnosis not present

## 2019-05-20 DIAGNOSIS — Z302 Encounter for sterilization: Secondary | ICD-10-CM | POA: Diagnosis not present

## 2019-05-25 DIAGNOSIS — J3089 Other allergic rhinitis: Secondary | ICD-10-CM | POA: Diagnosis not present

## 2019-05-25 DIAGNOSIS — Z302 Encounter for sterilization: Secondary | ICD-10-CM | POA: Diagnosis not present

## 2019-05-25 DIAGNOSIS — J301 Allergic rhinitis due to pollen: Secondary | ICD-10-CM | POA: Diagnosis not present

## 2019-05-25 DIAGNOSIS — J3081 Allergic rhinitis due to animal (cat) (dog) hair and dander: Secondary | ICD-10-CM | POA: Diagnosis not present

## 2019-05-28 DIAGNOSIS — J3089 Other allergic rhinitis: Secondary | ICD-10-CM | POA: Diagnosis not present

## 2019-06-01 DIAGNOSIS — N452 Orchitis: Secondary | ICD-10-CM | POA: Diagnosis not present

## 2019-06-02 DIAGNOSIS — R8271 Bacteriuria: Secondary | ICD-10-CM | POA: Diagnosis not present

## 2019-06-08 DIAGNOSIS — N452 Orchitis: Secondary | ICD-10-CM | POA: Diagnosis not present

## 2019-06-23 DIAGNOSIS — J3081 Allergic rhinitis due to animal (cat) (dog) hair and dander: Secondary | ICD-10-CM | POA: Diagnosis not present

## 2019-06-23 DIAGNOSIS — J3089 Other allergic rhinitis: Secondary | ICD-10-CM | POA: Diagnosis not present

## 2019-06-23 DIAGNOSIS — J301 Allergic rhinitis due to pollen: Secondary | ICD-10-CM | POA: Diagnosis not present

## 2019-06-25 ENCOUNTER — Other Ambulatory Visit: Payer: Self-pay

## 2019-06-25 DIAGNOSIS — Z0000 Encounter for general adult medical examination without abnormal findings: Secondary | ICD-10-CM

## 2019-06-30 ENCOUNTER — Other Ambulatory Visit: Payer: 59

## 2019-06-30 ENCOUNTER — Ambulatory Visit (INDEPENDENT_AMBULATORY_CARE_PROVIDER_SITE_OTHER): Payer: 59 | Admitting: Family Medicine

## 2019-06-30 ENCOUNTER — Other Ambulatory Visit: Payer: Self-pay

## 2019-06-30 ENCOUNTER — Encounter: Payer: Self-pay | Admitting: Family Medicine

## 2019-06-30 VITALS — BP 126/76 | HR 64 | Temp 98.2°F | Ht 72.0 in | Wt 221.0 lb

## 2019-06-30 DIAGNOSIS — Z87448 Personal history of other diseases of urinary system: Secondary | ICD-10-CM

## 2019-06-30 DIAGNOSIS — L819 Disorder of pigmentation, unspecified: Secondary | ICD-10-CM

## 2019-06-30 DIAGNOSIS — Z0000 Encounter for general adult medical examination without abnormal findings: Secondary | ICD-10-CM | POA: Diagnosis not present

## 2019-06-30 LAB — LIPID PANEL
Cholesterol: 236 mg/dL — ABNORMAL HIGH (ref 0–200)
HDL: 53.7 mg/dL (ref >39.00)
LDL Cholesterol: 152 mg/dL — ABNORMAL HIGH (ref 0–99)
NonHDL: 181.92
Total CHOL/HDL Ratio: 4
Triglycerides: 151 mg/dL — ABNORMAL HIGH (ref 0.0–149.0)
VLDL: 30.2 mg/dL (ref 0.0–40.0)

## 2019-06-30 LAB — COMPREHENSIVE METABOLIC PANEL
ALT: 29 U/L (ref 0–53)
AST: 21 U/L (ref 0–37)
Albumin: 4.7 g/dL (ref 3.5–5.2)
Alkaline Phosphatase: 91 U/L (ref 39–117)
BUN: 11 mg/dL (ref 6–23)
CO2: 27 mEq/L (ref 19–32)
Calcium: 9.8 mg/dL (ref 8.4–10.5)
Chloride: 102 mEq/L (ref 96–112)
Creatinine, Ser: 0.94 mg/dL (ref 0.40–1.50)
GFR: 91.02 mL/min (ref >60.00)
Glucose, Bld: 103 mg/dL — ABNORMAL HIGH (ref 70–99)
Potassium: 4 mEq/L (ref 3.5–5.1)
Sodium: 137 mEq/L (ref 135–145)
Total Bilirubin: 0.8 mg/dL (ref 0.2–1.2)
Total Protein: 7.5 g/dL (ref 6.0–8.3)

## 2019-06-30 LAB — TSH: TSH: 0.74 u[IU]/mL (ref 0.35–4.50)

## 2019-06-30 LAB — CBC
HCT: 47.5 % (ref 39.0–52.0)
Hemoglobin: 16.3 g/dL (ref 13.0–17.0)
MCHC: 34.3 g/dL (ref 30.0–36.0)
MCV: 92.7 fl (ref 78.0–100.0)
Platelets: 253 10*3/uL (ref 150.0–400.0)
RBC: 5.13 Mil/uL (ref 4.22–5.81)
RDW: 11.6 % (ref 11.5–15.5)
WBC: 5.6 10*3/uL (ref 4.0–10.5)

## 2019-06-30 NOTE — Assessment & Plan Note (Signed)
Check CBC, CMET, TSH.

## 2019-06-30 NOTE — Progress Notes (Signed)
Chief Complaint:  Juan Allen is a 33 y.o. male who presents today for his annual comprehensive physical exam.    Assessment/Plan:  History of interstitial nephritis Check CBC, CMET, TSH.   Pigmented Lesion He will come back for skin biopsy.   Body mass index is 29.97 kg/m. / Overweight BMI Metric Follow Up - 06/30/19 1054      BMI Metric Follow Up-Please document annually   BMI Metric Follow Up  Education provided        Preventative Healthcare: Check CBC, CMET, TSH, and lipid panel.   Patient Counseling(The following topics were reviewed and/or handout was given):  -Nutrition: Stressed importance of moderation in sodium/caffeine intake, saturated fat and cholesterol, caloric balance, sufficient intake of fresh fruits, vegetables, and fiber.  -Stressed the importance of regular exercise.   -Substance Abuse: Discussed cessation/primary prevention of tobacco, alcohol, or other drug use; driving or other dangerous activities under the influence; availability of treatment for abuse.   -Injury prevention: Discussed safety belts, safety helmets, smoke detector, smoking near bedding or upholstery.   -Sexuality: Discussed sexually transmitted diseases, partner selection, use of condoms, avoidance of unintended pregnancy and contraceptive alternatives.   -Dental health: Discussed importance of regular tooth brushing, flossing, and dental visits.  -Health maintenance and immunizations reviewed. Please refer to Health maintenance section.  Return to care in 1 year for next preventative visit.     Subjective:  HPI:  He has no acute complaints today.   He noticed a mole on his left foot yesterday.  Does not think that it was there previously.  Lifestyle Diet: None specific. Exercise: Active with work.   Depression screen PHQ 2/9 06/30/2019  Decreased Interest 0  Down, Depressed, Hopeless 0  PHQ - 2 Score 0    Health Maintenance Due  Topic Date Due  . HIV Screening   11/26/1998     ROS: Per HPI, otherwise a complete review of systems was negative.   PMH:  The following were reviewed and entered/updated in epic: Past Medical History:  Diagnosis Date  . Interstitial nephritis    Patient Active Problem List   Diagnosis Date Noted  . Plica of knee, left [id number]  . History of interstitial nephritis 12/08/2017  . Seasonal allergies 12/08/2017  . Rash 12/08/2017   Past Surgical History:  Procedure Laterality Date  . ELBOW ARTHROSCOPY Left    medial epidondyle injury  . SURGERY SCROTAL / TESTICULAR  2001   scrotal laceration needed 150 stitches    Family History  Problem Relation Age of Onset  . Depression Mother   . Cancer Maternal Grandfather        bladder cancer  . Diabetes Maternal Grandfather   . Heart disease Maternal Grandfather   . Dementia Paternal Grandmother   . Hypertension Paternal Grandfather     Medications- reviewed and updated Current Outpatient Medications  Medication Sig Dispense Refill  . azelastine (ASTELIN) 0.1 % nasal spray Place 2 sprays into both nostrils 2 (two) times daily. Use in each nostril as directed 30 mL 12   No current facility-administered medications for this visit.     Allergies-reviewed and updated Allergies  Allergen Reactions  . Nsaids     Social History   Socioeconomic History  . Marital status: Married    Spouse name: Not on file  . Number of children: Not on file  . Years of education: Not on file  . Highest education level: Not on file  Occupational History  .  Not on file  Social Needs  . Financial resource strain: Not on file  . Food insecurity    Worry: Not on file    Inability: Not on file  . Transportation needs    Medical: Not on file    Non-medical: Not on file  Tobacco Use  . Smoking status: Former Smoker    Types: Cigarettes  . Smokeless tobacco: Never Used  Substance and Sexual Activity  . Alcohol use: Yes    Alcohol/week: 6.0 standard drinks    Types: 6  Standard drinks or equivalent per week  . Drug use: Never  . Sexual activity: Yes    Partners: Female  Lifestyle  . Physical activity    Days per week: Not on file    Minutes per session: Not on file  . Stress: Not on file  Relationships  . Social Herbalist on phone: Not on file    Gets together: Not on file    Attends religious service: Not on file    Active member of club or organization: Not on file    Attends meetings of clubs or organizations: Not on file    Relationship status: Not on file  Other Topics Concern  . Not on file  Social History Narrative  . Not on file        Objective:  Physical Exam: BP 126/76   Pulse 64   Temp 98.2 F (36.8 C)   Ht 6' (1.829 m)   Wt 221 lb (100.2 kg)   SpO2 96%   BMI 29.97 kg/m   Body mass index is 29.97 kg/m. Wt Readings from Last 3 Encounters:  06/30/19 221 lb (100.2 kg)  10/22/18 212 lb (96.2 kg)  08/07/18 212 lb 3.2 oz (96.3 kg)   Gen: NAD, resting comfortably HEENT: TMs normal bilaterally. OP clear. No thyromegaly noted.  CV: RRR with no murmurs appreciated Pulm: NWOB, CTAB with no crackles, wheezes, or rhonchi GI: Normal bowel sounds present. Soft, Nontender, Nondistended. MSK: no edema, cyanosis, or clubbing noted Skin: Warm, dry. Irregular 97mm nevus on left foot in interdigital area.  Neuro: CN2-12 grossly intact. Strength 5/5 in upper and lower extremities. Reflexes symmetric and intact bilaterally.  Psych: Normal affect and thought content     Juan Allen M. Jerline Pain, MD 06/30/2019 10:54 AM

## 2019-07-01 ENCOUNTER — Encounter: Payer: Self-pay | Admitting: Family Medicine

## 2019-07-01 DIAGNOSIS — E785 Hyperlipidemia, unspecified: Secondary | ICD-10-CM | POA: Insufficient documentation

## 2019-07-01 DIAGNOSIS — R739 Hyperglycemia, unspecified: Secondary | ICD-10-CM | POA: Insufficient documentation

## 2019-07-01 NOTE — Progress Notes (Signed)
Cholesterol and blood sugar up slightly - do not need to start meds, but he should continue working on diet and exercise and we can recheck in a year or so.  Juan Allen. Jerline Pain, MD 07/01/2019 1:10 PM

## 2019-07-05 ENCOUNTER — Other Ambulatory Visit: Payer: Self-pay

## 2019-07-06 ENCOUNTER — Encounter: Payer: Self-pay | Admitting: Family Medicine

## 2019-07-06 ENCOUNTER — Other Ambulatory Visit: Payer: Self-pay

## 2019-07-06 ENCOUNTER — Ambulatory Visit (INDEPENDENT_AMBULATORY_CARE_PROVIDER_SITE_OTHER): Payer: 59 | Admitting: Family Medicine

## 2019-07-06 VITALS — BP 126/73 | HR 75 | Temp 98.2°F | Ht 72.0 in | Wt 225.4 lb

## 2019-07-06 DIAGNOSIS — L819 Disorder of pigmentation, unspecified: Secondary | ICD-10-CM

## 2019-07-06 DIAGNOSIS — D2272 Melanocytic nevi of left lower limb, including hip: Secondary | ICD-10-CM | POA: Diagnosis not present

## 2019-07-06 DIAGNOSIS — D229 Melanocytic nevi, unspecified: Secondary | ICD-10-CM

## 2019-07-06 HISTORY — DX: Melanocytic nevi, unspecified: D22.9

## 2019-07-06 NOTE — Progress Notes (Signed)
   Chief Complaint:  Juan Allen IV is a 33 y.o. male who presents today with a chief complaint of pigmented lesion.   Assessment/Plan:  Pigmented Lesion Biopsy performed today.  See below procedure note.  Patient tolerated well.  Discussed care after.  Can use over-the-counter analgesics if needed.    Subjective:  HPI:  Pigmented Lesion Located on left foot. Noticed a week ago. Has not changed since then.    ROS: Per HPI  PMH: He reports that he has quit smoking. His smoking use included cigarettes. He has never used smokeless tobacco. He reports current alcohol use of about 6.0 standard drinks of alcohol per week. He reports that he does not use drugs.      Objective:  Physical Exam: BP 126/73   Pulse 75   Temp 98.2 F (36.8 C)   Ht 6' (1.829 m)   Wt 225 lb 6.1 oz (102.2 kg)   SpO2 96%   BMI 30.57 kg/m   Gen: NAD, resting comfortably Skin: Approximately 77mm irregular appearing pigmented lesion on interdigital area of left foot.   After informed written consent was obtained, using Betadine for cleansing and 1% Lidocaine without epinephrine for anesthetic, with sterile technique a 4 mm punch biopsy was used to obtain a biopsy specimen of the lesion. Hemostasis was obtained by pressure and wound was sutured with a single interrupted suture.  Antibiotic dressing is applied, and wound care instructions provided. Be alert for any signs of cutaneous infection. The specimen is labeled and sent to pathology for evaluation. The procedure was well tolerated without complications.      [name]. Jerline Pain, MD 07/06/2019 8:30 AM

## 2019-07-09 DIAGNOSIS — J301 Allergic rhinitis due to pollen: Secondary | ICD-10-CM | POA: Diagnosis not present

## 2019-07-09 DIAGNOSIS — J3081 Allergic rhinitis due to animal (cat) (dog) hair and dander: Secondary | ICD-10-CM | POA: Diagnosis not present

## 2019-07-09 DIAGNOSIS — J3089 Other allergic rhinitis: Secondary | ICD-10-CM | POA: Diagnosis not present

## 2019-07-19 ENCOUNTER — Other Ambulatory Visit: Payer: Self-pay | Admitting: Family Medicine

## 2019-07-19 MED ORDER — VALACYCLOVIR HCL 1 G PO TABS: ORAL_TABLET | ORAL | 0 refills | Status: DC

## 2019-07-29 DIAGNOSIS — J3089 Other allergic rhinitis: Secondary | ICD-10-CM | POA: Diagnosis not present

## 2019-07-29 DIAGNOSIS — J301 Allergic rhinitis due to pollen: Secondary | ICD-10-CM | POA: Diagnosis not present

## 2019-07-29 DIAGNOSIS — J3081 Allergic rhinitis due to animal (cat) (dog) hair and dander: Secondary | ICD-10-CM | POA: Diagnosis not present

## 2019-07-30 DIAGNOSIS — J3089 Other allergic rhinitis: Secondary | ICD-10-CM | POA: Diagnosis not present

## 2019-08-02 DIAGNOSIS — J3089 Other allergic rhinitis: Secondary | ICD-10-CM | POA: Diagnosis not present

## 2019-08-02 DIAGNOSIS — J301 Allergic rhinitis due to pollen: Secondary | ICD-10-CM | POA: Diagnosis not present

## 2019-08-02 DIAGNOSIS — J3081 Allergic rhinitis due to animal (cat) (dog) hair and dander: Secondary | ICD-10-CM | POA: Diagnosis not present

## 2019-08-04 DIAGNOSIS — J3081 Allergic rhinitis due to animal (cat) (dog) hair and dander: Secondary | ICD-10-CM | POA: Diagnosis not present

## 2019-08-04 DIAGNOSIS — J301 Allergic rhinitis due to pollen: Secondary | ICD-10-CM | POA: Diagnosis not present

## 2019-08-04 DIAGNOSIS — J3089 Other allergic rhinitis: Secondary | ICD-10-CM | POA: Diagnosis not present

## 2019-08-09 DIAGNOSIS — J301 Allergic rhinitis due to pollen: Secondary | ICD-10-CM | POA: Diagnosis not present

## 2019-08-09 DIAGNOSIS — J3081 Allergic rhinitis due to animal (cat) (dog) hair and dander: Secondary | ICD-10-CM | POA: Diagnosis not present

## 2019-08-09 DIAGNOSIS — J3089 Other allergic rhinitis: Secondary | ICD-10-CM | POA: Diagnosis not present

## 2019-08-24 DIAGNOSIS — J301 Allergic rhinitis due to pollen: Secondary | ICD-10-CM | POA: Diagnosis not present

## 2019-08-24 DIAGNOSIS — J3081 Allergic rhinitis due to animal (cat) (dog) hair and dander: Secondary | ICD-10-CM | POA: Diagnosis not present

## 2019-08-24 DIAGNOSIS — J3089 Other allergic rhinitis: Secondary | ICD-10-CM | POA: Diagnosis not present

## 2019-08-31 DIAGNOSIS — J3081 Allergic rhinitis due to animal (cat) (dog) hair and dander: Secondary | ICD-10-CM | POA: Diagnosis not present

## 2019-08-31 DIAGNOSIS — J301 Allergic rhinitis due to pollen: Secondary | ICD-10-CM | POA: Diagnosis not present

## 2019-08-31 DIAGNOSIS — J3089 Other allergic rhinitis: Secondary | ICD-10-CM | POA: Diagnosis not present

## 2019-09-16 DIAGNOSIS — J3089 Other allergic rhinitis: Secondary | ICD-10-CM | POA: Diagnosis not present

## 2019-09-16 DIAGNOSIS — J3081 Allergic rhinitis due to animal (cat) (dog) hair and dander: Secondary | ICD-10-CM | POA: Diagnosis not present

## 2019-09-16 DIAGNOSIS — J301 Allergic rhinitis due to pollen: Secondary | ICD-10-CM | POA: Diagnosis not present

## 2019-10-27 ENCOUNTER — Ambulatory Visit: Payer: 59 | Attending: Internal Medicine

## 2019-10-27 DIAGNOSIS — Z20822 Contact with and (suspected) exposure to covid-19: Secondary | ICD-10-CM | POA: Diagnosis not present

## 2019-10-28 LAB — NOVEL CORONAVIRUS, NAA: SARS-CoV-2, NAA: NOT DETECTED

## 2019-10-28 LAB — SARS-COV-2, NAA 2 DAY TAT

## 2019-11-12 ENCOUNTER — Other Ambulatory Visit: Payer: Self-pay | Admitting: Family Medicine

## 2019-11-12 DIAGNOSIS — D239 Other benign neoplasm of skin, unspecified: Secondary | ICD-10-CM

## 2019-11-12 NOTE — Progress Notes (Signed)
amb  

## 2019-11-29 DIAGNOSIS — J3081 Allergic rhinitis due to animal (cat) (dog) hair and dander: Secondary | ICD-10-CM | POA: Diagnosis not present

## 2019-11-29 DIAGNOSIS — J3089 Other allergic rhinitis: Secondary | ICD-10-CM | POA: Diagnosis not present

## 2019-11-29 DIAGNOSIS — J301 Allergic rhinitis due to pollen: Secondary | ICD-10-CM | POA: Diagnosis not present

## 2019-12-17 DIAGNOSIS — J3081 Allergic rhinitis due to animal (cat) (dog) hair and dander: Secondary | ICD-10-CM | POA: Diagnosis not present

## 2019-12-17 DIAGNOSIS — J3089 Other allergic rhinitis: Secondary | ICD-10-CM | POA: Diagnosis not present

## 2019-12-17 DIAGNOSIS — J301 Allergic rhinitis due to pollen: Secondary | ICD-10-CM | POA: Diagnosis not present

## 2019-12-27 DIAGNOSIS — J301 Allergic rhinitis due to pollen: Secondary | ICD-10-CM | POA: Diagnosis not present

## 2019-12-27 DIAGNOSIS — J3081 Allergic rhinitis due to animal (cat) (dog) hair and dander: Secondary | ICD-10-CM | POA: Diagnosis not present

## 2019-12-27 DIAGNOSIS — J3089 Other allergic rhinitis: Secondary | ICD-10-CM | POA: Diagnosis not present

## 2020-01-06 DIAGNOSIS — J301 Allergic rhinitis due to pollen: Secondary | ICD-10-CM | POA: Diagnosis not present

## 2020-01-06 DIAGNOSIS — J3081 Allergic rhinitis due to animal (cat) (dog) hair and dander: Secondary | ICD-10-CM | POA: Diagnosis not present

## 2020-01-06 DIAGNOSIS — J3089 Other allergic rhinitis: Secondary | ICD-10-CM | POA: Diagnosis not present

## 2020-01-13 DIAGNOSIS — J3089 Other allergic rhinitis: Secondary | ICD-10-CM | POA: Diagnosis not present

## 2020-01-13 DIAGNOSIS — J301 Allergic rhinitis due to pollen: Secondary | ICD-10-CM | POA: Diagnosis not present

## 2020-01-13 DIAGNOSIS — J3081 Allergic rhinitis due to animal (cat) (dog) hair and dander: Secondary | ICD-10-CM | POA: Diagnosis not present

## 2020-01-20 DIAGNOSIS — J3081 Allergic rhinitis due to animal (cat) (dog) hair and dander: Secondary | ICD-10-CM | POA: Diagnosis not present

## 2020-01-20 DIAGNOSIS — J301 Allergic rhinitis due to pollen: Secondary | ICD-10-CM | POA: Diagnosis not present

## 2020-01-20 DIAGNOSIS — J3089 Other allergic rhinitis: Secondary | ICD-10-CM | POA: Diagnosis not present

## 2020-01-25 DIAGNOSIS — J3089 Other allergic rhinitis: Secondary | ICD-10-CM | POA: Diagnosis not present

## 2020-01-25 DIAGNOSIS — J3081 Allergic rhinitis due to animal (cat) (dog) hair and dander: Secondary | ICD-10-CM | POA: Diagnosis not present

## 2020-01-25 DIAGNOSIS — J301 Allergic rhinitis due to pollen: Secondary | ICD-10-CM | POA: Diagnosis not present

## 2020-01-27 DIAGNOSIS — J3089 Other allergic rhinitis: Secondary | ICD-10-CM | POA: Diagnosis not present

## 2020-01-27 DIAGNOSIS — J301 Allergic rhinitis due to pollen: Secondary | ICD-10-CM | POA: Diagnosis not present

## 2020-01-27 DIAGNOSIS — J3081 Allergic rhinitis due to animal (cat) (dog) hair and dander: Secondary | ICD-10-CM | POA: Diagnosis not present

## 2020-01-28 DIAGNOSIS — J3081 Allergic rhinitis due to animal (cat) (dog) hair and dander: Secondary | ICD-10-CM | POA: Diagnosis not present

## 2020-01-28 DIAGNOSIS — J301 Allergic rhinitis due to pollen: Secondary | ICD-10-CM | POA: Diagnosis not present

## 2020-01-28 DIAGNOSIS — J3089 Other allergic rhinitis: Secondary | ICD-10-CM | POA: Diagnosis not present

## 2020-01-31 DIAGNOSIS — J301 Allergic rhinitis due to pollen: Secondary | ICD-10-CM | POA: Diagnosis not present

## 2020-01-31 DIAGNOSIS — J3089 Other allergic rhinitis: Secondary | ICD-10-CM | POA: Diagnosis not present

## 2020-01-31 DIAGNOSIS — J3081 Allergic rhinitis due to animal (cat) (dog) hair and dander: Secondary | ICD-10-CM | POA: Diagnosis not present

## 2020-02-02 DIAGNOSIS — J3081 Allergic rhinitis due to animal (cat) (dog) hair and dander: Secondary | ICD-10-CM | POA: Diagnosis not present

## 2020-02-02 DIAGNOSIS — J301 Allergic rhinitis due to pollen: Secondary | ICD-10-CM | POA: Diagnosis not present

## 2020-02-02 DIAGNOSIS — J3089 Other allergic rhinitis: Secondary | ICD-10-CM | POA: Diagnosis not present

## 2020-02-04 DIAGNOSIS — J3081 Allergic rhinitis due to animal (cat) (dog) hair and dander: Secondary | ICD-10-CM | POA: Diagnosis not present

## 2020-02-04 DIAGNOSIS — J3089 Other allergic rhinitis: Secondary | ICD-10-CM | POA: Diagnosis not present

## 2020-02-04 DIAGNOSIS — J301 Allergic rhinitis due to pollen: Secondary | ICD-10-CM | POA: Diagnosis not present

## 2020-02-08 DIAGNOSIS — J301 Allergic rhinitis due to pollen: Secondary | ICD-10-CM | POA: Diagnosis not present

## 2020-02-08 DIAGNOSIS — J3081 Allergic rhinitis due to animal (cat) (dog) hair and dander: Secondary | ICD-10-CM | POA: Diagnosis not present

## 2020-02-08 DIAGNOSIS — J3089 Other allergic rhinitis: Secondary | ICD-10-CM | POA: Diagnosis not present

## 2020-02-11 DIAGNOSIS — J301 Allergic rhinitis due to pollen: Secondary | ICD-10-CM | POA: Diagnosis not present

## 2020-02-11 DIAGNOSIS — J3089 Other allergic rhinitis: Secondary | ICD-10-CM | POA: Diagnosis not present

## 2020-02-11 DIAGNOSIS — J3081 Allergic rhinitis due to animal (cat) (dog) hair and dander: Secondary | ICD-10-CM | POA: Diagnosis not present

## 2020-02-23 DIAGNOSIS — J3081 Allergic rhinitis due to animal (cat) (dog) hair and dander: Secondary | ICD-10-CM | POA: Diagnosis not present

## 2020-02-23 DIAGNOSIS — J301 Allergic rhinitis due to pollen: Secondary | ICD-10-CM | POA: Diagnosis not present

## 2020-02-23 DIAGNOSIS — J3089 Other allergic rhinitis: Secondary | ICD-10-CM | POA: Diagnosis not present

## 2020-02-25 DIAGNOSIS — J3089 Other allergic rhinitis: Secondary | ICD-10-CM | POA: Diagnosis not present

## 2020-02-25 DIAGNOSIS — J3081 Allergic rhinitis due to animal (cat) (dog) hair and dander: Secondary | ICD-10-CM | POA: Diagnosis not present

## 2020-02-25 DIAGNOSIS — J301 Allergic rhinitis due to pollen: Secondary | ICD-10-CM | POA: Diagnosis not present

## 2020-02-28 DIAGNOSIS — J3089 Other allergic rhinitis: Secondary | ICD-10-CM | POA: Diagnosis not present

## 2020-02-28 DIAGNOSIS — J301 Allergic rhinitis due to pollen: Secondary | ICD-10-CM | POA: Diagnosis not present

## 2020-02-28 DIAGNOSIS — J3081 Allergic rhinitis due to animal (cat) (dog) hair and dander: Secondary | ICD-10-CM | POA: Diagnosis not present

## 2020-03-01 DIAGNOSIS — J3081 Allergic rhinitis due to animal (cat) (dog) hair and dander: Secondary | ICD-10-CM | POA: Diagnosis not present

## 2020-03-01 DIAGNOSIS — J3089 Other allergic rhinitis: Secondary | ICD-10-CM | POA: Diagnosis not present

## 2020-03-01 DIAGNOSIS — J301 Allergic rhinitis due to pollen: Secondary | ICD-10-CM | POA: Diagnosis not present

## 2020-03-06 ENCOUNTER — Other Ambulatory Visit: Payer: Self-pay | Admitting: Family Medicine

## 2020-03-06 DIAGNOSIS — J301 Allergic rhinitis due to pollen: Secondary | ICD-10-CM | POA: Diagnosis not present

## 2020-03-06 DIAGNOSIS — J3089 Other allergic rhinitis: Secondary | ICD-10-CM | POA: Diagnosis not present

## 2020-03-06 DIAGNOSIS — J3081 Allergic rhinitis due to animal (cat) (dog) hair and dander: Secondary | ICD-10-CM | POA: Diagnosis not present

## 2020-03-06 MED ORDER — IVERMECTIN 3 MG PO TABS: 200.0000 ug/kg | ORAL_TABLET | Freq: Every day | ORAL | 0 refills | Status: AC

## 2020-03-06 MED ORDER — AZITHROMYCIN 500 MG PO TABS: 500.0000 mg | ORAL_TABLET | Freq: Every day | ORAL | 0 refills | Status: AC

## 2020-03-15 DIAGNOSIS — J3081 Allergic rhinitis due to animal (cat) (dog) hair and dander: Secondary | ICD-10-CM | POA: Diagnosis not present

## 2020-03-15 DIAGNOSIS — J301 Allergic rhinitis due to pollen: Secondary | ICD-10-CM | POA: Diagnosis not present

## 2020-03-15 DIAGNOSIS — J3089 Other allergic rhinitis: Secondary | ICD-10-CM | POA: Diagnosis not present

## 2020-03-22 DIAGNOSIS — J301 Allergic rhinitis due to pollen: Secondary | ICD-10-CM | POA: Diagnosis not present

## 2020-03-22 DIAGNOSIS — J3089 Other allergic rhinitis: Secondary | ICD-10-CM | POA: Diagnosis not present

## 2020-03-22 DIAGNOSIS — J3081 Allergic rhinitis due to animal (cat) (dog) hair and dander: Secondary | ICD-10-CM | POA: Diagnosis not present

## 2020-03-28 DIAGNOSIS — J3081 Allergic rhinitis due to animal (cat) (dog) hair and dander: Secondary | ICD-10-CM | POA: Diagnosis not present

## 2020-03-28 DIAGNOSIS — J301 Allergic rhinitis due to pollen: Secondary | ICD-10-CM | POA: Diagnosis not present

## 2020-03-28 DIAGNOSIS — J3089 Other allergic rhinitis: Secondary | ICD-10-CM | POA: Diagnosis not present

## 2020-04-03 ENCOUNTER — Other Ambulatory Visit: Payer: Self-pay

## 2020-04-03 ENCOUNTER — Ambulatory Visit (INDEPENDENT_AMBULATORY_CARE_PROVIDER_SITE_OTHER): Payer: 59 | Admitting: Dermatology

## 2020-04-03 ENCOUNTER — Encounter: Payer: Self-pay | Admitting: Dermatology

## 2020-04-03 DIAGNOSIS — L814 Other melanin hyperpigmentation: Secondary | ICD-10-CM

## 2020-04-03 DIAGNOSIS — Z808 Family history of malignant neoplasm of other organs or systems: Secondary | ICD-10-CM | POA: Diagnosis not present

## 2020-04-03 DIAGNOSIS — Z1283 Encounter for screening for malignant neoplasm of skin: Secondary | ICD-10-CM

## 2020-04-03 NOTE — Patient Instructions (Addendum)
Complete skin examination on Juan Allen IV date of birth 1985-11-03.  I know many of [name]'s family members for many years.  Skin examination from his feet to his scalp showed no atypical moles or nonmole skin cancer.  His very observant wife noticed a pigmented spot on the left upper nose; with dermoscopy this shows a few extra blood vessels as well as normal pigmentation called solar lentigo.  This is considered to be a very low risk lesion that does not need watching or biopsy unless there is obvious clinical change.  He does a really good job with sun protection.  The family history of an aunt with melanoma is unlikely to put him at greater risk.  He should have his wife look at his back twice annually and he may choose to have a skin specialist do a general skin check every 1 to 3 years.  He knows that he can call me anytime with any questions.

## 2020-04-04 DIAGNOSIS — J3081 Allergic rhinitis due to animal (cat) (dog) hair and dander: Secondary | ICD-10-CM | POA: Diagnosis not present

## 2020-04-04 DIAGNOSIS — J3089 Other allergic rhinitis: Secondary | ICD-10-CM | POA: Diagnosis not present

## 2020-04-04 DIAGNOSIS — J301 Allergic rhinitis due to pollen: Secondary | ICD-10-CM | POA: Diagnosis not present

## 2020-04-10 DIAGNOSIS — J301 Allergic rhinitis due to pollen: Secondary | ICD-10-CM | POA: Diagnosis not present

## 2020-04-10 DIAGNOSIS — J3081 Allergic rhinitis due to animal (cat) (dog) hair and dander: Secondary | ICD-10-CM | POA: Diagnosis not present

## 2020-04-10 DIAGNOSIS — J3089 Other allergic rhinitis: Secondary | ICD-10-CM | POA: Diagnosis not present

## 2020-04-14 DIAGNOSIS — J301 Allergic rhinitis due to pollen: Secondary | ICD-10-CM | POA: Diagnosis not present

## 2020-04-14 DIAGNOSIS — J3081 Allergic rhinitis due to animal (cat) (dog) hair and dander: Secondary | ICD-10-CM | POA: Diagnosis not present

## 2020-04-14 DIAGNOSIS — J3089 Other allergic rhinitis: Secondary | ICD-10-CM | POA: Diagnosis not present

## 2020-04-20 DIAGNOSIS — J3089 Other allergic rhinitis: Secondary | ICD-10-CM | POA: Diagnosis not present

## 2020-04-20 DIAGNOSIS — J301 Allergic rhinitis due to pollen: Secondary | ICD-10-CM | POA: Diagnosis not present

## 2020-04-20 DIAGNOSIS — J3081 Allergic rhinitis due to animal (cat) (dog) hair and dander: Secondary | ICD-10-CM | POA: Diagnosis not present

## 2020-04-25 DIAGNOSIS — J301 Allergic rhinitis due to pollen: Secondary | ICD-10-CM | POA: Diagnosis not present

## 2020-04-25 DIAGNOSIS — J3081 Allergic rhinitis due to animal (cat) (dog) hair and dander: Secondary | ICD-10-CM | POA: Diagnosis not present

## 2020-04-25 DIAGNOSIS — J3089 Other allergic rhinitis: Secondary | ICD-10-CM | POA: Diagnosis not present

## 2020-04-29 ENCOUNTER — Encounter: Payer: Self-pay | Admitting: Dermatology

## 2020-04-29 NOTE — Progress Notes (Signed)
   New Patient   Subjective  Juan Allen is a 34 y.o. male who presents for the following: Annual Exam (left side of nose- dark per wife).  Complete skin examination Location:  Duration:  Quality: Wife sees a discolored spot on the upper nose Associated Signs/Symptoms: Modifying Factors:  Severity:  Timing: Context:    The following portions of the chart were reviewed this encounter and updated as appropriate: Tobacco  Allergies  Meds  Problems  Med Hx  Surg Hx  Fam Hx      Objective  Well appearing patient in no apparent distress; mood and affect are within normal limits.  A full examination was performed including scalp, head, eyes, ears, nose, lips, neck, chest, axillae, abdomen, back, buttocks, bilateral upper extremities, bilateral lower extremities, hands, feet, fingers, toes, fingernails, and toenails. All findings within normal limits unless otherwise noted below.   Assessment & Plan  Encounter for screening for malignant neoplasm of skin Chest - Medial [facility])  Self examination skin with wife twice annually.  Complete skin examination on Juan Allen date of birth 05-Oct-1985.  I know many of Juan Allen's family members for many years.  Skin examination from his feet to his scalp showed no atypical moles or nonmole skin cancer.  His very observant wife noticed a pigmented spot on the left u Skin cancer screening performed today. pper nose; with dermoscopy this shows a few extra blood vessels as well as normal pigmentation called solar lentigo.  This is considered to be a very low risk lesion that does not need watching or biopsy unless there is obvious clinical change.  He does a really good job with sun protection.  The family history of an aunt with melanoma is unlikely to put him at greater risk.  He should have his wife look at his back twice annually and he may choose to have a skin specialist do a general skin check every 1 to 3 years.  He knows  that he can call me anytime with any questions.

## 2020-05-01 DIAGNOSIS — J3089 Other allergic rhinitis: Secondary | ICD-10-CM | POA: Diagnosis not present

## 2020-05-01 DIAGNOSIS — J3081 Allergic rhinitis due to animal (cat) (dog) hair and dander: Secondary | ICD-10-CM | POA: Diagnosis not present

## 2020-05-01 DIAGNOSIS — J301 Allergic rhinitis due to pollen: Secondary | ICD-10-CM | POA: Diagnosis not present

## 2020-05-08 DIAGNOSIS — J301 Allergic rhinitis due to pollen: Secondary | ICD-10-CM | POA: Diagnosis not present

## 2020-05-08 DIAGNOSIS — J3081 Allergic rhinitis due to animal (cat) (dog) hair and dander: Secondary | ICD-10-CM | POA: Diagnosis not present

## 2020-05-08 DIAGNOSIS — J3089 Other allergic rhinitis: Secondary | ICD-10-CM | POA: Diagnosis not present

## 2020-05-17 DIAGNOSIS — J3081 Allergic rhinitis due to animal (cat) (dog) hair and dander: Secondary | ICD-10-CM | POA: Diagnosis not present

## 2020-05-17 DIAGNOSIS — J3089 Other allergic rhinitis: Secondary | ICD-10-CM | POA: Diagnosis not present

## 2020-05-17 DIAGNOSIS — J301 Allergic rhinitis due to pollen: Secondary | ICD-10-CM | POA: Diagnosis not present

## 2020-05-24 DIAGNOSIS — J3089 Other allergic rhinitis: Secondary | ICD-10-CM | POA: Diagnosis not present

## 2020-05-24 DIAGNOSIS — J301 Allergic rhinitis due to pollen: Secondary | ICD-10-CM | POA: Diagnosis not present

## 2020-05-24 DIAGNOSIS — J3081 Allergic rhinitis due to animal (cat) (dog) hair and dander: Secondary | ICD-10-CM | POA: Diagnosis not present

## 2020-05-31 DIAGNOSIS — J3089 Other allergic rhinitis: Secondary | ICD-10-CM | POA: Diagnosis not present

## 2020-05-31 DIAGNOSIS — J3081 Allergic rhinitis due to animal (cat) (dog) hair and dander: Secondary | ICD-10-CM | POA: Diagnosis not present

## 2020-05-31 DIAGNOSIS — J301 Allergic rhinitis due to pollen: Secondary | ICD-10-CM | POA: Diagnosis not present

## 2020-06-19 DIAGNOSIS — J3089 Other allergic rhinitis: Secondary | ICD-10-CM | POA: Diagnosis not present

## 2020-06-19 DIAGNOSIS — J301 Allergic rhinitis due to pollen: Secondary | ICD-10-CM | POA: Diagnosis not present

## 2020-06-19 DIAGNOSIS — J3081 Allergic rhinitis due to animal (cat) (dog) hair and dander: Secondary | ICD-10-CM | POA: Diagnosis not present

## 2020-07-05 DIAGNOSIS — J3081 Allergic rhinitis due to animal (cat) (dog) hair and dander: Secondary | ICD-10-CM | POA: Diagnosis not present

## 2020-07-05 DIAGNOSIS — J3089 Other allergic rhinitis: Secondary | ICD-10-CM | POA: Diagnosis not present

## 2020-07-05 DIAGNOSIS — J301 Allergic rhinitis due to pollen: Secondary | ICD-10-CM | POA: Diagnosis not present

## 2020-07-11 ENCOUNTER — Encounter: Payer: 59 | Admitting: Family Medicine

## 2020-08-01 ENCOUNTER — Encounter: Payer: 59 | Admitting: Family Medicine

## 2020-08-01 DIAGNOSIS — Z0289 Encounter for other administrative examinations: Secondary | ICD-10-CM

## 2020-08-11 ENCOUNTER — Other Ambulatory Visit: Payer: Self-pay

## 2020-08-11 DIAGNOSIS — U071 COVID-19: Secondary | ICD-10-CM

## 2020-08-13 LAB — SARS-COV-2, NAA 2 DAY TAT

## 2020-08-13 LAB — NOVEL CORONAVIRUS, NAA: SARS-CoV-2, NAA: DETECTED — AB

## 2020-08-14 ENCOUNTER — Encounter: Payer: Self-pay | Admitting: Physician Assistant

## 2020-08-14 ENCOUNTER — Telehealth: Payer: Self-pay | Admitting: Physician Assistant

## 2020-08-14 DIAGNOSIS — Z6830 Body mass index (BMI) 30.0-30.9, adult: Secondary | ICD-10-CM | POA: Insufficient documentation

## 2020-08-14 NOTE — Telephone Encounter (Signed)
Called to discuss with Juan Allen about Covid symptoms and the use of sotrovimab, remdisivir or oral therapies for those with mild to moderate Covid symptoms and at a high risk of hospitalization.     Pt does not qualify as pt has asymptomatic infection. Isolation precautions discussed. Advised to contact back for consideration should they develop symptoms. Patient verbalized understanding.      Patient Active Problem List   Diagnosis Date Noted  . BMI 30.0-30.9,adult   . Dyslipidemia 07/01/2019  . Hyperglycemia 07/01/2019  . Plica of knee, left 09/31/1216  . History of interstitial nephritis 12/08/2017  . Seasonal allergies 12/08/2017  . Rash 12/08/2017    Juan Form PA-C

## 2020-08-14 NOTE — Progress Notes (Signed)
Dr Marigene Ehlers interpretation of your lab work:  COVID test is positive.    If you have any additional questions, please give [place] a call or send [place] a message through [place].  Take care, Dr Jerline Pain

## 2020-08-30 DIAGNOSIS — S42001K Fracture of unspecified part of right clavicle, subsequent encounter for fracture with nonunion: Secondary | ICD-10-CM | POA: Diagnosis not present

## 2020-09-04 ENCOUNTER — Other Ambulatory Visit: Payer: Self-pay | Admitting: Orthopedic Surgery

## 2020-09-04 NOTE — Progress Notes (Signed)
Surgical Instructions    Your procedure is scheduled on 09/07/20.  Report to [facility] Main Entrance "A" at 06:00 A.M., then check in with the Admitting office.  Call this number if you have problems the morning of surgery:  [phone]   If you have any questions prior to your surgery date call [phone]: Open Monday-Friday 8am-4pm    Remember:  Do not eat or drink after midnight the night before your surgery     Take these medicines the morning of surgery with A SIP OF WATER  valACYclovir (VALTREX) if needed  As of today, STOP taking any Aspirin (unless otherwise instructed by your surgeon) Aleve, Naproxen, Ibuprofen, Motrin, Advil, Goody's, BC's, all herbal medications, fish oil, and all vitamins.                     Do not wear jewelry, make up, or nail polish            Do not wear lotions, powders, perfumes/colognes, or deodorant.            Do not shave 48 hours prior to surgery.  Men may shave face and neck.            Do not bring valuables to the hospital.            [facility] is not responsible for any belongings or valuables.  Do NOT Smoke (Tobacco/Vaping) or drink Alcohol 24 hours prior to your procedure If you use a CPAP at night, you may bring all equipment for your overnight stay.   Contacts, glasses, dentures or bridgework may not be worn into surgery, please bring cases for these belongings   For patients admitted to the hospital, discharge time will be determined by your treatment team.   Patients discharged the day of surgery will not be allowed to drive home, and someone needs to stay with them for 24 hours.    Special instructions:   [facility]- Preparing For Surgery  Before surgery, you can play an important role. Because skin is not sterile, your skin needs to be as free of germs as possible. You can reduce the number of germs on your skin by washing with CHG (chlorahexidine gluconate) Soap before surgery.  CHG is an antiseptic cleaner which  kills germs and bonds with the skin to continue killing germs even after washing.    Oral Hygiene is also important to reduce your risk of infection.  Remember - BRUSH YOUR TEETH THE MORNING OF SURGERY WITH YOUR REGULAR TOOTHPASTE  Please do not use if you have an allergy to CHG or antibacterial soaps. If your skin becomes reddened/irritated stop using the CHG.  Do not shave (including legs and underarms) for at least 48 hours prior to first CHG shower. It is OK to shave your face.  Please follow these instructions carefully.   1. Shower the NIGHT BEFORE SURGERY and the MORNING OF SURGERY  2. If you chose to wash your hair, wash your hair first as usual with your normal shampoo.  3. After you shampoo, rinse your hair and body thoroughly to remove the shampoo.  4. Wash Face and genitals (private parts) with your normal soap.   5.  Shower the NIGHT BEFORE SURGERY and the MORNING OF SURGERY with CHG Soap.   6. Use CHG Soap as you would any other liquid soap. You can apply CHG directly to the skin and wash gently with a scrungie or a clean washcloth.  7. Apply the CHG Soap to your body ONLY FROM THE NECK DOWN.  Do not use on open wounds or open sores. Avoid contact with your eyes, ears, mouth and genitals (private parts). Wash Face and genitals (private parts)  with your normal soap.   8. Wash thoroughly, paying special attention to the area where your surgery will be performed.  9. Thoroughly rinse your body with warm water from the neck down.  10. DO NOT shower/wash with your normal soap after using and rinsing off the CHG Soap.  11. Pat yourself dry with a CLEAN TOWEL.  12. Wear CLEAN PAJAMAS to bed the night before surgery  13. Place CLEAN SHEETS on your bed the night before your surgery  14. DO NOT SLEEP WITH PETS.   Day of Surgery: Take a shower.  Wear Clean/Comfortable clothing the morning of surgery Do not apply any deodorants/lotions.   Remember to brush your teeth  WITH YOUR REGULAR TOOTHPASTE.   Please read over the following fact sheets that you were given.

## 2020-09-05 ENCOUNTER — Encounter (HOSPITAL_COMMUNITY)
Admission: RE | Admit: 2020-09-05 | Discharge: 2020-09-05 | Disposition: A | Discharge status: Home / Self Care | Payer: 59 | Source: Ambulatory Visit | Attending: Orthopedic Surgery | Admitting: Orthopedic Surgery

## 2020-09-05 ENCOUNTER — Encounter (HOSPITAL_COMMUNITY): Payer: Self-pay | Admitting: Orthopedic Surgery

## 2020-09-05 ENCOUNTER — Ambulatory Visit
Admission: RE | Admit: 2020-09-05 | Discharge: 2020-09-05 | Disposition: A | Discharge status: Home / Self Care | Payer: 59 | Source: Ambulatory Visit | Attending: Orthopedic Surgery | Admitting: Orthopedic Surgery

## 2020-09-05 ENCOUNTER — Other Ambulatory Visit: Payer: Self-pay | Admitting: Orthopedic Surgery

## 2020-09-05 ENCOUNTER — Other Ambulatory Visit: Payer: Self-pay

## 2020-09-05 DIAGNOSIS — S42031A Displaced fracture of lateral end of right clavicle, initial encounter for closed fracture: Secondary | ICD-10-CM | POA: Diagnosis not present

## 2020-09-05 DIAGNOSIS — S42001A Fracture of unspecified part of right clavicle, initial encounter for closed fracture: Secondary | ICD-10-CM

## 2020-09-05 DIAGNOSIS — Z01818 Encounter for other preprocedural examination: Secondary | ICD-10-CM | POA: Diagnosis not present

## 2020-09-05 DIAGNOSIS — Z01812 Encounter for preprocedural laboratory examination: Secondary | ICD-10-CM | POA: Diagnosis not present

## 2020-09-05 LAB — CBC
HCT: 46.1 % (ref 39.0–52.0)
Hemoglobin: 15.7 g/dL (ref 13.0–17.0)
MCH: 31.9 pg (ref 26.0–34.0)
MCHC: 34.1 g/dL (ref 30.0–36.0)
MCV: 93.7 fL (ref 80.0–100.0)
Platelets: 315 10*3/uL (ref 150–400)
RBC: 4.92 MIL/uL (ref 4.22–5.81)
RDW: 11.2 % — ABNORMAL LOW (ref 11.5–15.5)
WBC: 6.4 10*3/uL (ref 4.0–10.5)
nRBC: 0 % (ref 0.0–0.2)

## 2020-09-05 NOTE — Progress Notes (Signed)
Surgical Instructions    Your procedure is scheduled on Thursday, February 17th.  Report to [facility] Main Entrance "A" at 6:00 A.M., then check in with the Admitting office.  Call this number if you have problems the morning of surgery:  [phone]   If you have any questions prior to your surgery date call [phone]: Open Monday-Friday 8am-4pm    Remember:  Do not eat after midnight the night before your surgery  You may drink clear liquids until 5:00AM the morning of your surgery.   Clear liquids allowed are: Water, Non-Citrus Juices (without pulp), Carbonated Beverages, Clear Tea, Black Coffee Only, and Gatorade    Take these medicines the morning of surgery with A SIP OF WATER   Valtrex - if needed  As of today, STOP taking any Aspirin (unless otherwise instructed by your surgeon) Aleve, Naproxen, Ibuprofen, Motrin, Advil, Goody's, BC's, all herbal medications, fish oil, and all vitamins.                     Do not wear jewelry            Do not wear lotions, powders, colognes, or deodorant.            Men may shave face and neck.            Do not bring valuables to the hospital.            [facility] is not responsible for any belongings or valuables.  Do NOT Smoke (Tobacco/Vaping) or drink Alcohol 24 hours prior to your procedure If you use a CPAP at night, you may bring all equipment for your overnight stay.   Contacts, glasses, dentures or bridgework may not be worn into surgery, please bring cases for these belongings   For patients admitted to the hospital, discharge time will be determined by your treatment team.   Patients discharged the day of surgery will not be allowed to drive home, and someone needs to stay with them for 24 hours.    Special instructions:   [facility]- Preparing For Surgery  Before surgery, you can play an important role. Because skin is not sterile, your skin needs to be as free of germs as possible. You can reduce the number of  germs on your skin by washing with CHG (chlorahexidine gluconate) Soap before surgery.  CHG is an antiseptic cleaner which kills germs and bonds with the skin to continue killing germs even after washing.    Oral Hygiene is also important to reduce your risk of infection.  Remember - BRUSH YOUR TEETH THE MORNING OF SURGERY WITH YOUR REGULAR TOOTHPASTE  Please do not use if you have an allergy to CHG or antibacterial soaps. If your skin becomes reddened/irritated stop using the CHG.  Do not shave (including legs and underarms) for at least 48 hours prior to first CHG shower. It is OK to shave your face.  Please follow these instructions carefully.   1. Shower the NIGHT BEFORE SURGERY and the MORNING OF SURGERY  2. If you chose to wash your hair, wash your hair first as usual with your normal shampoo.  3. After you shampoo, rinse your hair and body thoroughly to remove the shampoo.  4. Wash Face and genitals (private parts) with your normal soap.   5.  Shower the NIGHT BEFORE SURGERY and the MORNING OF SURGERY with CHG Soap.   6. Use CHG Soap as you would any other liquid soap.  You can apply CHG directly to the skin and wash gently with a scrungie or a clean washcloth.   7. Apply the CHG Soap to your body ONLY FROM THE NECK DOWN.  Do not use on open wounds or open sores. Avoid contact with your eyes, ears, mouth and genitals (private parts). Wash Face and genitals (private parts)  with your normal soap.   8. Wash thoroughly, paying special attention to the area where your surgery will be performed.  9. Thoroughly rinse your body with warm water from the neck down.  10. DO NOT shower/wash with your normal soap after using and rinsing off the CHG Soap.  11. Pat yourself dry with a CLEAN TOWEL.  12. Wear CLEAN PAJAMAS to bed the night before surgery  13. Place CLEAN SHEETS on your bed the night before your surgery  14. DO NOT SLEEP WITH PETS.   Day of Surgery: Wear Clean/Comfortable  clothing the morning of surgery Do not apply any deodorants/lotions.   Remember to brush your teeth WITH YOUR REGULAR TOOTHPASTE.   Please read over the following fact sheets that you were given.

## 2020-09-05 NOTE — Progress Notes (Signed)
PCP - Dimas Chyle Cardiologist - denies  Chest x-ray - n/a EKG - n/a  COVID TEST- (+) back in January - do not need to retest.  Ok to proceed with surgery.   Anesthesia review: n/a  Patient denies shortness of breath, fever, cough and chest pain at PAT appointment   All instructions explained to the patient, with a verbal understanding of the material. Patient agrees to go over the instructions while at home for a better understanding. Patient also instructed to self quarantine after being tested for COVID-19. The opportunity to ask questions was provided.

## 2020-09-06 ENCOUNTER — Encounter (HOSPITAL_COMMUNITY): Payer: Self-pay | Admitting: Orthopedic Surgery

## 2020-09-06 NOTE — Anesthesia Preprocedure Evaluation (Addendum)
Anesthesia Evaluation  Patient identified by MRN, date of birth, ID band Patient awake    Reviewed: Allergy & Precautions, NPO status , Patient's Chart, lab work & pertinent test results  Airway Mallampati: II  TM Distance: >3 FB Neck ROM: Full    Dental  (+) Teeth Intact, Dental Advisory Given, Chipped,    Pulmonary former smoker,  COVID + 1/22   Pulmonary exam normal breath sounds clear to auscultation       Cardiovascular Exercise Tolerance: Good negative cardio ROS Normal cardiovascular exam Rhythm:Regular Rate:Normal     Neuro/Psych negative neurological ROS  negative psych ROS   GI/Hepatic negative GI ROS, Neg liver ROS,   Endo/Other  Obesity   Renal/GU Renal disease (h/o interstitial nephritis)     Musculoskeletal FRACTURE RIGHT CLAVICLE   Abdominal   Peds  Hematology negative hematology ROS (+)   Anesthesia Other Findings   Reproductive/Obstetrics                           Anesthesia Physical Anesthesia Plan  ASA: II  Anesthesia Plan: General   Post-op Pain Management:  Regional for Post-op pain   Induction: Intravenous  PONV Risk Score and Plan: 3 and Midazolam, Dexamethasone and Ondansetron  Airway Management Planned: Oral ETT  Additional Equipment:   Intra-op Plan:   Post-operative Plan: Extubation in OR  Informed Consent: I have reviewed the patients History and Physical, chart, labs and discussed the procedure including the risks, benefits and alternatives for the proposed anesthesia with the patient or authorized representative who has indicated his/her understanding and acceptance.     Dental advisory given  Plan Discussed with: CRNA  Anesthesia Plan Comments:        Anesthesia Quick Evaluation

## 2020-09-07 ENCOUNTER — Ambulatory Visit (HOSPITAL_COMMUNITY): Payer: 59 | Admitting: Anesthesiology

## 2020-09-07 ENCOUNTER — Ambulatory Visit (HOSPITAL_COMMUNITY): Payer: 59

## 2020-09-07 ENCOUNTER — Encounter (HOSPITAL_COMMUNITY)
Admission: RE | Disposition: A | Discharge status: Home / Self Care | Payer: Self-pay | Source: Home / Self Care | Attending: Orthopedic Surgery

## 2020-09-07 ENCOUNTER — Ambulatory Visit (HOSPITAL_COMMUNITY)
Admission: RE | Admit: 2020-09-07 | Discharge: 2020-09-07 | Disposition: A | Discharge status: Home / Self Care | Payer: 59 | Attending: Orthopedic Surgery | Admitting: Orthopedic Surgery

## 2020-09-07 ENCOUNTER — Other Ambulatory Visit: Payer: Self-pay

## 2020-09-07 ENCOUNTER — Encounter (HOSPITAL_COMMUNITY): Payer: Self-pay | Admitting: Orthopedic Surgery

## 2020-09-07 DIAGNOSIS — Z885 Allergy status to narcotic agent status: Secondary | ICD-10-CM | POA: Insufficient documentation

## 2020-09-07 DIAGNOSIS — Y9323 Activity, snow (alpine) (downhill) skiing, snow boarding, sledding, tobogganing and snow tubing: Secondary | ICD-10-CM | POA: Diagnosis not present

## 2020-09-07 DIAGNOSIS — Z87891 Personal history of nicotine dependence: Secondary | ICD-10-CM | POA: Diagnosis not present

## 2020-09-07 DIAGNOSIS — S42001A Fracture of unspecified part of right clavicle, initial encounter for closed fracture: Secondary | ICD-10-CM | POA: Diagnosis not present

## 2020-09-07 DIAGNOSIS — J302 Other seasonal allergic rhinitis: Secondary | ICD-10-CM | POA: Diagnosis not present

## 2020-09-07 DIAGNOSIS — Z886 Allergy status to analgesic agent status: Secondary | ICD-10-CM | POA: Insufficient documentation

## 2020-09-07 DIAGNOSIS — G8918 Other acute postprocedural pain: Secondary | ICD-10-CM | POA: Diagnosis not present

## 2020-09-07 DIAGNOSIS — S42031A Displaced fracture of lateral end of right clavicle, initial encounter for closed fracture: Secondary | ICD-10-CM | POA: Diagnosis not present

## 2020-09-07 DIAGNOSIS — T148XXA Other injury of unspecified body region, initial encounter: Secondary | ICD-10-CM

## 2020-09-07 DIAGNOSIS — E785 Hyperlipidemia, unspecified: Secondary | ICD-10-CM | POA: Diagnosis not present

## 2020-09-07 DIAGNOSIS — Z419 Encounter for procedure for purposes other than remedying health state, unspecified: Secondary | ICD-10-CM

## 2020-09-07 HISTORY — PX: ORIF CLAVICULAR FRACTURE: SHX5055

## 2020-09-07 SURGERY — OPEN REDUCTION INTERNAL FIXATION (ORIF) CLAVICULAR FRACTURE
Anesthesia: General | Site: Chest | Laterality: Right

## 2020-09-07 MED ORDER — MIDAZOLAM HCL 2 MG/2ML IJ SOLN
INTRAMUSCULAR | Status: AC
  Filled 2020-09-07: qty 2

## 2020-09-07 MED ORDER — ONDANSETRON HCL 4 MG/2ML IJ SOLN
INTRAMUSCULAR | Status: DC | PRN
  Administered 2020-09-07: 4 mg via INTRAVENOUS

## 2020-09-07 MED ORDER — FENTANYL CITRATE (PF) 250 MCG/5ML IJ SOLN
INTRAMUSCULAR | Status: AC
  Filled 2020-09-07: qty 5

## 2020-09-07 MED ORDER — MIDAZOLAM HCL 2 MG/2ML IJ SOLN: 2.0000 mg | Freq: Once | INTRAMUSCULAR | Status: AC

## 2020-09-07 MED ORDER — HYDROCODONE-ACETAMINOPHEN 5-325 MG PO TABS
1.0000 | ORAL_TABLET | Freq: Four times a day (QID) | ORAL | 0 refills | Status: DC | PRN
Start: 2020-09-07 — End: 2021-06-26

## 2020-09-07 MED ORDER — METHOCARBAMOL 500 MG PO TABS: 500.0000 mg | ORAL_TABLET | Freq: Three times a day (TID) | ORAL | 0 refills | Status: DC | PRN

## 2020-09-07 MED ORDER — CHLORHEXIDINE GLUCONATE 0.12 % MT SOLN
15.0000 mL | Freq: Once | OROMUCOSAL | Status: AC
  Administered 2020-09-07: 15 mL via OROMUCOSAL
  Filled 2020-09-07: qty 15

## 2020-09-07 MED ORDER — DEXAMETHASONE SODIUM PHOSPHATE 10 MG/ML IJ SOLN
INTRAMUSCULAR | Status: DC | PRN
  Administered 2020-09-07: 10 mg via INTRAVENOUS

## 2020-09-07 MED ORDER — AMISULPRIDE (ANTIEMETIC) 5 MG/2ML IV SOLN: 10.0000 mg | Freq: Once | INTRAVENOUS | Status: DC

## 2020-09-07 MED ORDER — MIDAZOLAM HCL 2 MG/2ML IJ SOLN
INTRAMUSCULAR | Status: AC
  Administered 2020-09-07: 2 mg via INTRAVENOUS
  Filled 2020-09-07: qty 2

## 2020-09-07 MED ORDER — AMISULPRIDE (ANTIEMETIC) 5 MG/2ML IV SOLN
INTRAVENOUS | Status: AC
  Filled 2020-09-07: qty 4

## 2020-09-07 MED ORDER — PROMETHAZINE HCL 25 MG/ML IJ SOLN: 6.2500 mg | INTRAMUSCULAR | Status: DC | PRN

## 2020-09-07 MED ORDER — FENTANYL CITRATE (PF) 100 MCG/2ML IJ SOLN
25.0000 ug | INTRAMUSCULAR | Status: DC | PRN
  Administered 2020-09-07: 50 ug via INTRAVENOUS

## 2020-09-07 MED ORDER — PROPOFOL 10 MG/ML IV BOLUS
INTRAVENOUS | Status: AC
  Filled 2020-09-07: qty 20

## 2020-09-07 MED ORDER — FENTANYL CITRATE (PF) 100 MCG/2ML IJ SOLN
INTRAMUSCULAR | Status: DC | PRN
  Administered 2020-09-07: 100 ug via INTRAVENOUS
  Administered 2020-09-07: 50 ug via INTRAVENOUS

## 2020-09-07 MED ORDER — ONDANSETRON HCL 4 MG/2ML IJ SOLN
INTRAMUSCULAR | Status: AC
  Filled 2020-09-07: qty 2

## 2020-09-07 MED ORDER — FENTANYL CITRATE (PF) 100 MCG/2ML IJ SOLN
INTRAMUSCULAR | Status: AC
  Administered 2020-09-07: 50 ug via INTRAVENOUS
  Filled 2020-09-07: qty 2

## 2020-09-07 MED ORDER — PROPOFOL 10 MG/ML IV BOLUS
INTRAVENOUS | Status: DC | PRN
  Administered 2020-09-07: 200 mg via INTRAVENOUS

## 2020-09-07 MED ORDER — BUPIVACAINE HCL (PF) 0.5 % IJ SOLN
INTRAMUSCULAR | Status: DC | PRN
  Administered 2020-09-07: 10 mL via PERINEURAL

## 2020-09-07 MED ORDER — SUGAMMADEX SODIUM 200 MG/2ML IV SOLN
INTRAVENOUS | Status: DC | PRN
  Administered 2020-09-07: 200 mg via INTRAVENOUS

## 2020-09-07 MED ORDER — LACTATED RINGERS IV SOLN: INTRAVENOUS | Status: DC

## 2020-09-07 MED ORDER — ACETAMINOPHEN 500 MG PO TABS
1000.0000 mg | ORAL_TABLET | Freq: Once | ORAL | Status: AC
  Administered 2020-09-07: 1000 mg via ORAL
  Filled 2020-09-07: qty 2

## 2020-09-07 MED ORDER — ROCURONIUM BROMIDE 10 MG/ML (PF) SYRINGE
PREFILLED_SYRINGE | INTRAVENOUS | Status: DC | PRN
  Administered 2020-09-07: 30 mg via INTRAVENOUS
  Administered 2020-09-07: 70 mg via INTRAVENOUS

## 2020-09-07 MED ORDER — LIDOCAINE 2% (20 MG/ML) 5 ML SYRINGE
INTRAMUSCULAR | Status: DC | PRN
  Administered 2020-09-07: 100 mg via INTRAVENOUS

## 2020-09-07 MED ORDER — BUPIVACAINE HCL (PF) 0.5 % IJ SOLN
INTRAMUSCULAR | Status: AC
  Filled 2020-09-07: qty 30

## 2020-09-07 MED ORDER — FENTANYL CITRATE (PF) 100 MCG/2ML IJ SOLN
INTRAMUSCULAR | Status: AC
  Administered 2020-09-07: 100 ug via INTRAVENOUS
  Filled 2020-09-07: qty 2

## 2020-09-07 MED ORDER — ACETAMINOPHEN 500 MG PO TABS: 500.0000 mg | ORAL_TABLET | Freq: Two times a day (BID) | ORAL | 0 refills | Status: DC

## 2020-09-07 MED ORDER — VITAMIN C 1000 MG PO TABS: 1000.0000 mg | ORAL_TABLET | Freq: Every day | ORAL | Status: DC

## 2020-09-07 MED ORDER — DIPHENHYDRAMINE HCL 50 MG/ML IJ SOLN
INTRAMUSCULAR | Status: AC
  Filled 2020-09-07: qty 1

## 2020-09-07 MED ORDER — ROCURONIUM BROMIDE 10 MG/ML (PF) SYRINGE
PREFILLED_SYRINGE | INTRAVENOUS | Status: AC
  Filled 2020-09-07: qty 10

## 2020-09-07 MED ORDER — GABAPENTIN 300 MG PO CAPS
300.0000 mg | ORAL_CAPSULE | Freq: Once | ORAL | Status: AC
  Administered 2020-09-07: 300 mg via ORAL
  Filled 2020-09-07: qty 1

## 2020-09-07 MED ORDER — ORAL CARE MOUTH RINSE: 15.0000 mL | Freq: Once | OROMUCOSAL | Status: AC

## 2020-09-07 MED ORDER — BUPIVACAINE LIPOSOME 1.3 % IJ SUSP
INTRAMUSCULAR | Status: DC | PRN
  Administered 2020-09-07: 10 mL via PERINEURAL

## 2020-09-07 MED ORDER — DIPHENHYDRAMINE HCL 50 MG/ML IJ SOLN
INTRAMUSCULAR | Status: DC | PRN
  Administered 2020-09-07: 25 mg via INTRAVENOUS

## 2020-09-07 MED ORDER — CEFAZOLIN SODIUM-DEXTROSE 2-4 GM/100ML-% IV SOLN
2.0000 g | INTRAVENOUS | Status: AC
  Administered 2020-09-07: 2 g via INTRAVENOUS
  Filled 2020-09-07: qty 100

## 2020-09-07 MED ORDER — DEXAMETHASONE SODIUM PHOSPHATE 10 MG/ML IJ SOLN
INTRAMUSCULAR | Status: AC
  Filled 2020-09-07: qty 1

## 2020-09-07 MED ORDER — LIDOCAINE 2% (20 MG/ML) 5 ML SYRINGE
INTRAMUSCULAR | Status: AC
  Filled 2020-09-07: qty 5

## 2020-09-07 MED ORDER — FENTANYL CITRATE (PF) 100 MCG/2ML IJ SOLN: 100.0000 ug | Freq: Once | INTRAMUSCULAR | Status: AC

## 2020-09-07 SURGICAL SUPPLY — 63 items
BIT DRILL 2.0 LNG QUCK RELEASE (BIT) ×1 IMPLANT
BIT DRILL 2.8X5 QR DISP (BIT) ×3 IMPLANT
BIT DRILL QUICK RELEASE 3.5MM (BIT) ×1 IMPLANT
BNDG COHESIVE 4X5 TAN STRL (GAUZE/BANDAGES/DRESSINGS) IMPLANT
BRUSH SCRUB EZ PLAIN DRY (MISCELLANEOUS) ×6 IMPLANT
CLOSURE WOUND 1/2 X4 (GAUZE/BANDAGES/DRESSINGS) ×2
COVER SURGICAL LIGHT HANDLE (MISCELLANEOUS) ×6 IMPLANT
COVER WAND RF STERILE (DRAPES) ×3 IMPLANT
DRAPE C-ARM 42X72 X-RAY (DRAPES) ×3 IMPLANT
DRAPE INCISE IOBAN 66X45 STRL (DRAPES) ×3 IMPLANT
DRAPE LAPAROTOMY TRNSV 102X78 (DRAPES) ×3 IMPLANT
DRAPE ORTHO SPLIT 77X108 STRL (DRAPES)
DRAPE SURG ORHT 6 SPLT 77X108 (DRAPES) IMPLANT
DRAPE U-SHAPE 47X51 STRL (DRAPES) ×6 IMPLANT
DRILL 2.0 LNG QUICK RELEASE (BIT) ×3
DRILL QUICK RELEASE 3.5MM (BIT) ×3
DRSG MEPILEX BORDER 4X8 (GAUZE/BANDAGES/DRESSINGS) ×3 IMPLANT
ELECT REM PT RETURN 9FT ADLT (ELECTROSURGICAL) ×3
ELECTRODE REM PT RTRN 9FT ADLT (ELECTROSURGICAL) ×1 IMPLANT
GLOVE BIO SURGEON STRL SZ7.5 (GLOVE) ×3 IMPLANT
GLOVE BIO SURGEON STRL SZ8 (GLOVE) ×3 IMPLANT
GLOVE BIOGEL PI IND STRL 7.5 (GLOVE) ×1 IMPLANT
GLOVE BIOGEL PI INDICATOR 7.5 (GLOVE) ×2
GLOVE SRG 8 PF TXTR STRL LF DI (GLOVE) ×1 IMPLANT
GLOVE SURG UNDER POLY LF SZ8 (GLOVE) ×3
GOWN STRL REUS W/ TWL LRG LVL3 (GOWN DISPOSABLE) ×2 IMPLANT
GOWN STRL REUS W/ TWL XL LVL3 (GOWN DISPOSABLE) ×1 IMPLANT
GOWN STRL REUS W/TWL LRG LVL3 (GOWN DISPOSABLE) ×6
GOWN STRL REUS W/TWL XL LVL3 (GOWN DISPOSABLE) ×3
GUIDEWIRE ORTHO .059X5 (WIRE) ×6 IMPLANT
KIT AC JOINT DISP (KITS) ×3 IMPLANT
KIT BASIN OR (CUSTOM PROCEDURE TRAY) ×3 IMPLANT
KIT TURNOVER KIT B (KITS) ×3 IMPLANT
MANIFOLD NEPTUNE II (INSTRUMENTS) ×3 IMPLANT
NEEDLE HYPO 25GX1X1/2 BEV (NEEDLE) IMPLANT
NS IRRIG 1000ML POUR BTL (IV SOLUTION) ×3 IMPLANT
PACK GENERAL/GYN (CUSTOM PROCEDURE TRAY) ×3 IMPLANT
PAD ARMBOARD 7.5X6 YLW CONV (MISCELLANEOUS) ×6 IMPLANT
PLATE RIGHT DISTAL CLAVICLE (Plate) ×3 IMPLANT
SCREW 2.3X12MM (Screw) ×3 IMPLANT
SCREW BN FT 16X2.3XLCK HEX CRT (Screw) ×1 IMPLANT
SCREW CORTICAL LOCKING 2.3X14M (Screw) ×9 IMPLANT
SCREW CORTICAL LOCKING 2.3X16M (Screw) ×3 IMPLANT
SCREW HEXALOBE NON-LOCK 3.5X16 (Screw) ×12 IMPLANT
SCREW NON LOCKING HEX 3.5X18MM (Screw) ×3 IMPLANT
SCREW NON TOGG 2.3X16MM (Screw) ×3 IMPLANT
SLING ARM IMMOBILIZER LRG (SOFTGOODS) ×3 IMPLANT
SLING ARM IMMOBILIZER MED (SOFTGOODS) IMPLANT
STAPLER VISISTAT 35W (STAPLE) ×3 IMPLANT
STOCKINETTE IMPERVIOUS 9X36 MD (GAUZE/BANDAGES/DRESSINGS) IMPLANT
STRIP CLOSURE SKIN 1/2X4 (GAUZE/BANDAGES/DRESSINGS) ×4 IMPLANT
SUCTION FRAZIER HANDLE 10FR (MISCELLANEOUS) ×3
SUCTION TUBE FRAZIER 10FR DISP (MISCELLANEOUS) ×1 IMPLANT
SUT MNCRL AB 3-0 PS2 27 (SUTURE) ×3 IMPLANT
SUT VIC AB 0 CT1 27 (SUTURE) ×3
SUT VIC AB 0 CT1 27XBRD ANBCTR (SUTURE) ×1 IMPLANT
SUT VIC AB 2-0 CT1 27 (SUTURE) ×3
SUT VIC AB 2-0 CT1 TAPERPNT 27 (SUTURE) ×1 IMPLANT
SYR CONTROL 10ML LL (SYRINGE) IMPLANT
TOWEL GREEN STERILE (TOWEL DISPOSABLE) ×3 IMPLANT
TOWEL GREEN STERILE FF (TOWEL DISPOSABLE) ×6 IMPLANT
WATER STERILE IRR 1000ML POUR (IV SOLUTION) ×3 IMPLANT
ZIPLOOP AC JOINT REPAIR (Orthopedic Implant) ×3 IMPLANT

## 2020-09-07 NOTE — Anesthesia Procedure Notes (Signed)
Anesthesia Regional Block: Interscalene brachial plexus block   Pre-Anesthetic Checklist: ,, timeout performed, Correct Patient, Correct Site, Correct Laterality, Correct Procedure, Correct Position, site marked, Risks and benefits discussed,  Surgical consent,  Pre-op evaluation,  At surgeon's request and post-op pain management  Laterality: Right  Prep: chloraprep       Needles:  Injection technique: Single-shot  Needle Type: Echogenic Stimulator Needle     Needle Length: 5cm  Needle Gauge: 22     Additional Needles:   Procedures:,,,, ultrasound used (permanent image in chart),,,,  Narrative:  Start time: 09/07/2020 7:19 AM End time: 09/07/2020 7:24 AM Injection made incrementally with aspirations every 5 mL.  Performed by: Personally  Anesthesiologist: Catalina Gravel, MD  Additional Notes: Functioning IV was confirmed and monitors were applied.  A 24mm 22ga Arrow echogenic stimulator needle was used. Sterile prep and drape, hand hygiene, and sterile gloves were used.  Negative aspiration and negative test dose prior to incremental administration of local anesthetic. The patient tolerated the procedure well.  Ultrasound guidance: relevent anatomy identified, needle position confirmed, local anesthetic spread visualized around nerve(s), vascular puncture avoided.  Image printed for medical record.

## 2020-09-07 NOTE — Anesthesia Procedure Notes (Signed)
Procedure Name: Intubation Date/Time: 09/07/2020 8:18 AM Performed by: Jenne Campus, CRNA Pre-anesthesia Checklist: Patient identified, Emergency Drugs available, Suction available and Patient being monitored Patient Re-evaluated:Patient Re-evaluated prior to induction Oxygen Delivery Method: Circle System Utilized Preoxygenation: Pre-oxygenation with 100% oxygen Induction Type: IV induction Ventilation: Mask ventilation without difficulty Laryngoscope Size: Miller and 3 Grade View: Grade I Tube type: Oral Tube size: 7.5 mm Number of attempts: 1 Airway Equipment and Method: Stylet Placement Confirmation: ETT inserted through vocal cords under direct vision,  positive ETCO2 and breath sounds checked- equal and bilateral Secured at: 22 cm Tube secured with: Tape Dental Injury: Teeth and Oropharynx as per pre-operative assessment
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
## 2020-09-07 NOTE — Transfer of Care (Signed)
Immediate Anesthesia Transfer of Care Note  Patient: Juan Allen  Procedure(s) Performed: OPEN REDUCTION INTERNAL FIXATION (ORIF) CLAVICULAR FRACTURE (Right Chest)  Patient Location: PACU  Anesthesia Type:GA combined with regional for post-op pain  Level of Consciousness: drowsy and patient cooperative  Airway & Oxygen Therapy: Patient Spontanous Breathing and Patient connected to nasal cannula oxygen  Post-op Assessment: Report given to RN and Post -op Vital signs reviewed and stable  Post vital signs: Reviewed and stable  Last Vitals:  Vitals Value Taken Time  BP 148/94   Temp 36.4 C 09/07/20 1100  Pulse 82 09/07/20 1100  Resp 14 09/07/20 1100  SpO2 97     Last Pain:  Vitals:   09/07/20 0730  TempSrc:   PainSc: 0-No pain      Patients Stated Pain Goal: 2 (71/16/57 [id number])  Complications: No complications documented.

## 2020-09-07 NOTE — Discharge Instructions (Addendum)
[facility] Discharge Instructions   General Discharge Instructions  Orthopaedic Injuries:  Right clavicle fracture treated with open reduction and internal fixation using plate and screws   WEIGHT BEARING STATUS: no lifting with R arm. Sling for comfort   RANGE OF MOTION/ACTIVITY: ok to move shoulder as tolerated. Come out of sling several times a day to prevent elbow stiffness  Bone health:  Continue with your home dose of vitamin d   Wound Care: daily wound care starting on 09/09/2020. See below  Discharge Wound Care Instructions  Do NOT apply any ointments, solutions or lotions to pin sites or surgical wounds.  These prevent needed drainage and even though solutions like hydrogen peroxide kill bacteria, they also damage cells lining the pin sites that help fight infection.  Applying lotions or ointments can keep the wounds moist and can cause them to breakdown and open up as well. This can increase the risk for infection. When in doubt call the office.  Surgical incisions should be dressed daily starting 09/09/2020  If any drainage is noted, use one layer of adaptic (oil emulsion gauze), 4x4 gauze and tape.  Alternatively you can use a mepilex type dressing (this is the dressing you currently have on, this dressing can be purchased at medical supply stores such as [facility] or [facility])  Once the incision is completely dry and without drainage, it may be left open to air out.  Showering may begin 36-48 hours later.  Cleaning gently with soap and water.   Diet: as you were eating previously.  Can use over the counter stool softeners and bowel preparations, such as Miralax, to help with bowel movements.  Narcotics can be constipating.  Be sure to drink plenty of fluids  PAIN MEDICATION USE AND EXPECTATIONS  You have likely been given narcotic medications to help control your pain.  After a traumatic event that results in an fracture (broken bone)  with or without surgery, it is ok to use narcotic pain medications to help control one's pain.  We understand that everyone responds to pain differently and each individual patient will be evaluated on a regular basis for the continued need for narcotic medications. Ideally, narcotic medication use should last no more than 6-8 weeks (coinciding with fracture healing).   As a patient it is your responsibility as well to monitor narcotic medication use and report the amount and frequency you use these medications when you come to your office visit.   We would also advise that if you are using narcotic medications, you should take a dose prior to therapy to maximize you participation.  IF YOU ARE ON NARCOTIC MEDICATIONS IT IS NOT PERMISSIBLE TO OPERATE A MOTOR VEHICLE (MOTORCYCLE/CAR/TRUCK/MOPED) OR HEAVY MACHINERY DO NOT MIX NARCOTICS WITH OTHER CNS (CENTRAL NERVOUS SYSTEM) DEPRESSANTS SUCH AS ALCOHOL   STOP SMOKING OR USING NICOTINE PRODUCTS!!!!  As discussed nicotine severely impairs your body's ability to heal surgical and traumatic wounds but also impairs bone healing.  Wounds and bone heal by forming microscopic blood vessels (angiogenesis) and nicotine is a vasoconstrictor (essentially, shrinks blood vessels).  Therefore, if vasoconstriction occurs to these microscopic blood vessels they essentially disappear and are unable to deliver necessary nutrients to the healing tissue.  This is one modifiable factor that you can do to dramatically increase your chances of healing your injury.    (This means no smoking, no nicotine gum, patches, etc)  DO NOT USE NONSTEROIDAL ANTI-INFLAMMATORY DRUGS (NSAID'S)  Using products such  as Advil (ibuprofen), Aleve (naproxen), Motrin (ibuprofen) for additional pain control during fracture healing can delay and/or prevent the healing response.  If you would like to take over the counter (OTC) medication, Tylenol (acetaminophen) is ok.  However, some narcotic  medications that are given for pain control contain acetaminophen as well. Therefore, you should not exceed more than 4000 mg of tylenol in a day if you do not have liver disease.  Also note that there are may OTC medicines, such as cold medicines and allergy medicines that my contain tylenol as well.  If you have any questions about medications and/or interactions please ask your doctor/PA or your pharmacist.      ICE AND ELEVATE INJURED/OPERATIVE EXTREMITY  Using ice and elevating the injured extremity above your heart can help with swelling and pain control.  Icing in a pulsatile fashion, such as 20 minutes on and 20 minutes off, can be followed.    Do not place ice directly on skin. Make sure there is a barrier between to skin and the ice pack.    Using frozen items such as frozen peas works well as the conform nicely to the are that needs to be iced.  USE AN ACE WRAP OR TED HOSE FOR SWELLING CONTROL  In addition to icing and elevation, Ace wraps or TED hose are used to help limit and resolve swelling.  It is recommended to use Ace wraps or TED hose until you are informed to stop.    When using Ace Wraps start the wrapping distally (farthest away from the body) and wrap proximally (closer to the body)   Example: If you had surgery on your leg or thing and you do not have a splint on, start the ace wrap at the toes and work your way up to the thigh        If you had surgery on your upper extremity and do not have a splint on, start the ace wrap at your fingers and work your way up to the upper arm  IF YOU ARE IN A SPLINT OR CAST DO NOT [place]   If your splint gets wet for any reason please contact the office immediately. You may shower in your splint or cast as long as you keep it dry.  This can be done by wrapping in a cast cover or garbage back (or similar)  Do Not stick any thing down your splint or cast such as pencils, money, or hangers to try and scratch yourself with.  If  you feel itchy take benadryl as prescribed on the bottle for itching  IF YOU ARE IN A CAM BOOT (BLACK BOOT)  You may remove boot periodically. Perform daily dressing changes as noted below.  Wash the liner of the boot regularly and wear a sock when wearing the boot. It is recommended that you sleep in the boot until told otherwise    Call office for the following:  Temperature greater than 101F  Persistent nausea and vomiting  Severe uncontrolled pain  Redness, tenderness, or signs of infection (pain, swelling, redness, odor or green/yellow discharge around the site)  Difficulty breathing, headache or visual disturbances  Hives  Persistent dizziness or light-headedness  Extreme fatigue  Any other questions or concerns you may have after discharge  In an emergency, call 911 or go to an Emergency Department at a nearby hospital  HELPFUL INFORMATION  ? If you had a block, it will wear off between 8-24  hrs postop typically.  This is period when your pain may go from nearly zero to the pain you would have had postop without the block.  This is an abrupt transition but nothing dangerous is happening.  You may take an extra dose of narcotic when this happens.  ? You should wean off your narcotic medicines as soon as you are able.  Most patients will be off or using minimal narcotics before their first postop appointment.   ? We suggest you use the pain medication the first night prior to going to bed, in order to ease any pain when the anesthesia wears off. You should avoid taking pain medications on an empty stomach as it will make you nauseous.  ? Do not drink alcoholic beverages or take illicit drugs when taking pain medications.  ? In most states it is against the law to drive while you are in a splint or sling.  And certainly against the law to drive while taking narcotics.  ? You may return to work/school in the next couple of days when you feel up to it.   ? Pain medication  may make you constipated.  Below are a few solutions to try in this order: - Decrease the amount of pain medication if you arent having pain. - Drink lots of decaffeinated fluids. - Drink prune juice and/or each dried prunes  o If the first 3 dont work start with additional solutions - Take Colace - an over-the-counter stool softener - Take Senokot - an over-the-counter laxative - Take Miralax - a stronger over-the-counter laxative  CALL THE OFFICE WITH ANY QUESTIONS OR CONCERNS: [phone]   VISIT OUR WEBSITE FOR ADDITIONAL INFORMATION: [email or web address]   General Anesthesia, Adult, Care After This sheet gives you information about how to care for yourself after your procedure. Your health care provider may also give you more specific instructions. If you have problems or questions, contact your health care provider. What can I expect after the procedure? After the procedure, the following side effects are common:  Pain or discomfort at the IV site.  Nausea.  Vomiting.  Sore throat.  Trouble concentrating.  Feeling cold or chills.  Feeling weak or tired.  Sleepiness and fatigue.  Soreness and body aches. These side effects can affect parts of the body that were not involved in surgery. Follow these instructions at home: For the time period you were told by your health care provider:  Rest.  Do not participate in activities where you could fall or become injured.  Do not drive or use machinery.  Do not drink alcohol.  Do not take sleeping pills or medicines that cause drowsiness.  Do not make important decisions or sign legal documents.  Do not take care of children on your own.   Eating and drinking  Follow any instructions from your health care provider about eating or drinking restrictions.  When you feel hungry, start by eating small amounts of foods that are soft and easy to digest (bland), such as toast. Gradually return to your regular  diet.  Drink enough fluid to keep your urine pale yellow.  If you vomit, rehydrate by drinking water, juice, or clear broth. General instructions  If you have sleep apnea, surgery and certain medicines can increase your risk for breathing problems. Follow instructions from your health care provider about wearing your sleep device: ? Anytime you are sleeping, including during daytime naps. ? While taking prescription pain medicines, sleeping medicines, or medicines that make you  drowsy.  Have a responsible adult stay with you for the time you are told. It is important to have someone help care for you until you are awake and alert.  Return to your normal activities as told by your health care provider. Ask your health care provider what activities are safe for you.  Take over-the-counter and prescription medicines only as told by your health care provider.  If you smoke, do not smoke without supervision.  Keep all follow-up visits as told by your health care provider. This is important. Contact a health care provider if:  You have nausea or vomiting that does not get better with medicine.  You cannot eat or drink without vomiting.  You have pain that does not get better with medicine.  You are unable to pass urine.  You develop a skin rash.  You have a fever.  You have redness around your IV site that gets worse. Get help right away if:  You have difficulty breathing.  You have chest pain.  You have blood in your urine or stool, or you vomit blood. Summary  After the procedure, it is common to have a sore throat or nausea. It is also common to feel tired.  Have a responsible adult stay with you for the time you are told. It is important to have someone help care for you until you are awake and alert.  When you feel hungry, start by eating small amounts of foods that are soft and easy to digest (bland), such as toast. Gradually return to your regular diet.  Drink  enough fluid to keep your urine pale yellow.  Return to your normal activities as told by your health care provider. Ask your health care provider what activities are safe for you. This information is not intended to replace advice given to you by your health care provider. Make sure you discuss any questions you have with your health care provider. Document Revised: 03/23/2020 Document Reviewed: 10/21/2019 Elsevier Patient Education  2021 [employer].   Interscalene Nerve Block, Care After This sheet gives you information about how to care for yourself after your procedure. Your health care provider may also give you more specific instructions. If you have problems or questions, contact your health care provider. What can I expect after the procedure? After the procedure, it is common to have:  Neck soreness or tenderness.  Numbness in your shoulder, upper arm, and some fingers.  Weakness in your shoulder and arm muscles. You may also have:  Voice changes.  A droopy eyelid.  Trouble swallowing.  A stuffy nose. Feeling and strength in your shoulder, arm, and fingers should return to normal within hours after your procedure or within a few days if you have a long, thin tube (nerve block catheter) in place to deliver medicine for the nerve block. Follow these instructions at home: Medicines  Take over-the-counter and prescription medicines only as told by your health care provider.  For the time period you were told by your health care provider, do not take sleeping pills or medicines that cause drowsiness.  Ask your health care provider if the medicine prescribed to you: ? Requires you to avoid driving or using machinery. ? Can cause constipation. You may need to take these actions to prevent or treat constipation:  Drink enough fluid to keep your urine pale yellow.  Take over-the-counter or prescription medicines.  Eat foods that are high in fiber, such as beans, whole  grains, and fresh fruits  and vegetables.  Limit foods that are high in fat and processed sugars, such as fried or sweet foods. Eating and drinking  Follow instructions from your health care provider about eating or drinking restrictions.  If you vomit, drink small sips of water, juice, or soup when you can drink without vomiting.  If you have trouble swallowing, take small bites when eating and small sips of water when drinking until you are able to swallow normally.  Make sure you have little or no nausea before eating solid foods. If you have a sling:  Wear it as told by your health care provider. Remove it only as told by your health care provider.  Loosen it if your fingers tingle, become numb, or turn cold and blue.  Make sure that your entire arm, including your wrist, is supported. Do not let your wrist dangle over the end of the sling.  Keep it clean.  If the sling is not waterproof: ? Do not let it get wet. ? Cover it with a watertight covering when you take a bath or shower. Bathing  Do not take baths, swim, or use a hot tub until your health care provider approves. Ask your health care provider if you may take showers. You may only be allowed to take sponge baths.  If you have a nerve block catheter in place, keep the injection site and tubing dry. Injection site care  Follow instructions from your health care provider about how to take care of your nerve block injection site. Make sure you: ? Wash your hands with soap and water for at least 20 seconds before and after you change your bandage (dressing). If soap and water are not available, use hand sanitizer. ? Change your dressing as told by your health care provider.  Keep your dressing dry.  Check your injection site every day for signs of infection. Check for: ? Redness, swelling, or pain. ? Fluid or blood. ? Warmth. ? Pus or a bad smell.   Activity  For the time period you were told by your health care  provider: ? Do not participate in activities where you could fall or become injured. ? Do not drink alcohol. ? Do not make important decisions or sign legal documents. ? Do not take care of children on your own.  Do not lift anything that is heavier than 10 lb (4.5 kg), or the limit that you are told, until your health care provider says that it is safe.  Do not drive or use machinery until you have fully recovered. Ask your health care provider when it is safe to drive.  Rest as told by your health care provider. This will help you recover faster.  Be very careful until you have regained strength and feeling.  Return to your normal activities as told by your health care provider. Ask your health care provider what activities are safe for you.   General instructions  Have a responsible adult stay with you until the feeling in your shoulder, arm, and fingers returns to normal.  Do not use any products that contain nicotine or tobacco. These products include cigarettes, chewing tobacco, and vaping devices, such as e-cigarettes. These can delay healing. If you need help quitting, ask your health care provider.  Do not expose your arm or shoulder to very cold or very hot temperatures until you have full feeling back.  If you have a nerve block catheter in place: ? Try to keep the catheter from getting  kinked or pinched. ? Avoid pulling or tugging on the catheter.  Keep all follow-up visits. This is important. Contact a health care provider if:  You have a fever or chills.  You have any of these signs of infection: ? Redness, swelling, or pain around your injection site. ? Fluid or blood coming from your injection site. ? Warmth coming from your injection site. ? Pus or a bad smell coming from your injection site.  You have hoarseness, a drooping eye, or dry eye that lasts more than a few days.  You have pain that is poorly controlled with the block or with pain medicine.  You  have numbness, tingling, or weakness in your shoulder or arm that lasts for more than 1 week. Get help right away if you:  Have severe pain.  Lose or do not regain strength and feeling in your arm even after the nerve block medicine has stopped.  Have trouble breathing. These symptoms may represent a serious problem that is an emergency. Do not wait to see if the symptoms will go away. Get medical help right away. Call your local emergency services (911 in the [place] Do not drive yourself to the hospital. Summary  After the procedure, it is common to have neck soreness, weakness in the shoulder and arm muscles, and numbness in the upper arm, shoulder, and some fingers.  Follow instructions from your health care provider about how to take care of your nerve block injection site.  Keep all follow-up visits. This information is not intended to replace advice given to you by your health care provider. Make sure you discuss any questions you have with your health care provider. Document Revised: 01/11/2020 Document Reviewed: 01/11/2020 Elsevier Patient Education  [employer].

## 2020-09-07 NOTE — Anesthesia Postprocedure Evaluation (Signed)
Anesthesia Post Note  Patient: Juan Allen  Procedure(s) Performed: OPEN REDUCTION INTERNAL FIXATION (ORIF) CLAVICULAR FRACTURE (Right Chest)     Patient location during evaluation: PACU Anesthesia Type: General Level of consciousness: awake and alert, oriented and awake Pain management: pain level controlled Vital Signs Assessment: post-procedure vital signs reviewed and stable Respiratory status: spontaneous breathing, nonlabored ventilation and respiratory function stable Cardiovascular status: blood pressure returned to baseline and stable Postop Assessment: no apparent nausea or vomiting Anesthetic complications: no   No complications documented.  Last Vitals:  Vitals:   09/07/20 1140 09/07/20 1150  BP: (!) 143/90 140/86  Pulse: 72 73  Resp: 15 13  Temp:  36.7 C  SpO2: 95% 96%    Last Pain:  Vitals:   09/07/20 1150  TempSrc:   PainSc: 0-No pain                 Catalina Gravel

## 2020-09-07 NOTE — Progress Notes (Signed)
Waiting for patient ride. Vital signs stable. No further monitoring required.   Gabriel Rainwater, RN

## 2020-09-07 NOTE — H&P (Addendum)
[facility] H&P/Consult     Patient ID: GERSHON SHORTEN MRN: [id number] DOB/AGE: [birth year] 35 y.o.  Chief Complaint: Right clavicle fracture HPI: IOKEPA GEFFRE IV is an 35 y.o. male.with distal clavicle fracture, comminuted and disrupted CC ligaments with elevation. Confirmed by CT.   Past Medical History:  Diagnosis Date  . Atypical mole 07/06/2019   mod-left foot - Dr Jerline Pain  . BMI 30.0-30.9,adult   . Interstitial nephritis 2018    Past Surgical History:  Procedure Laterality Date  . ELBOW ARTHROSCOPY Left    medial epidondyle injury  . SURGERY SCROTAL / TESTICULAR  2001   scrotal laceration needed 150 stitches  . WISDOM TOOTH EXTRACTION      Family History  Problem Relation Age of Onset  . Depression Mother   . Cancer Maternal Grandfather        bladder cancer  . Diabetes Maternal Grandfather   . Heart disease Maternal Grandfather   . Dementia Paternal Grandmother   . Hypertension Paternal Grandfather   . Melanoma Maternal Aunt    Social History:  reports that he has quit smoking. His smoking use included cigarettes. He has never used smokeless tobacco. He reports current alcohol use of about 6.0 standard drinks of alcohol per week. He reports that he does not use drugs.  Allergies:  Allergies  Allergen Reactions  . Nsaids Other (See Comments)    unknown  . Dilaudid [Hydromorphone] Hives and Rash    Medications Prior to Admission  Medication Sig Dispense Refill  . Cholecalciferol (VITAMIN D-3) 125 MCG (5000 UT) TABS Take 5,000 Units by mouth every other day.    Marland Kitchen EPINEPHrine 0.3 mg/0.3 mL IJ SOAJ injection Inject 0.3 mg into the muscle as needed for anaphylaxis.    Marland Kitchen ibuprofen (ADVIL) 200 MG tablet Take 800 mg by mouth every 8 (eight) hours as needed (PAIN).    . NON FORMULARY Inject 1 Syringe as directed once a week. ALLERGY SHOTS    . valACYclovir (VALTREX) 1000 MG tablet Take 2 tablets twice daily for 1 day at onset of  outbreak (Patient taking differently: Take 1,000 mg by mouth 2 (two) times daily as needed (onset of outbreak).) 30 tablet 0    Results for orders placed or performed during the hospital encounter of 09/05/20 (from the past 48 hour(s))  CBC     Status: Abnormal   Collection Time: 09/05/20  1:31 PM  Result Value Ref Range   WBC 6.4 4.0 - 10.5 K/uL   RBC 4.92 4.22 - 5.81 MIL/uL   Hemoglobin 15.7 13.0 - 17.0 g/dL   HCT 46.1 39.0 - 52.0 %   MCV 93.7 80.0 - 100.0 fL   MCH 31.9 26.0 - 34.0 pg   MCHC 34.1 30.0 - 36.0 g/dL   RDW 11.2 (L) 11.5 - 15.5 %   Platelets 315 150 - 400 K/uL   nRBC 0.0 0.0 - 0.2 %    Comment: Performed at [facility], [place] [address] [place], [place] [address]   CT SHOULDER RIGHT WO CONTRAST  Result Date: 09/05/2020 CLINICAL DATA:  Pre-op evaluation for right clavicle fracture sustained 09/07/2020 in snowboarding accident. EXAM: CT OF THE UPPER RIGHT EXTREMITY WITHOUT CONTRAST TECHNIQUE: Multidetector CT imaging of the right shoulder was performed according to the standard protocol. COMPARISON:  None. FINDINGS: Bones/Joint/Cartilage There is a comminuted and mildly displaced fracture of the distal right clavicle. This fracture demonstrates no definite intra-articular extension. There is a  well corticated ossific fragment anterior to the distal right clavicle which measures 2.0 cm on image 23/2. This has sclerotic margins and may relate to remote trauma or an accessory ossicle. The coracoclavicular distance is widened to 2.9 cm, and there is a probable small avulsion fracture from the coracoid process the of the coracoclavicular ligament (coronal image 46/6). The acromioclavicular joint is mildly widened, and relative to the acromion, there is slight superior displacement of the distal clavicle. The sternoclavicular joints are intact. The right scapula and proximal right humerus appear intact. No significant shoulder joint effusion. Ligaments Suspected acromioclavicular and  coracoclavicular ligamentous injuries as described above. Muscles and Tendons The right shoulder musculature appears unremarkable. Soft tissues Soft tissue swelling without focal hematoma surrounding the distal right clavicle fracture. No impingement on the brachial plexus identified. The visualized right chest contents appear unremarkable. IMPRESSION: 1. Comminuted and mildly displaced fracture of the distal right clavicle as described. No definite intra-articular extension. 2. Suspected acromioclavicular and coracoclavicular ligamentous injuries as described. 3. Well corticated ossific fragment anterior to the distal right clavicle may relate to remote trauma or an accessory ossicle. 4. The right scapula and proximal right humerus appear intact. Electronically Signed   By: Richardean Sale M.D.   On: 09/05/2020 15:32    ROS No recent fever, bleeding abnormalities, urologic dysfunction, GI problems, or weight gain.   Blood pressure (!) 141/84, pulse 65, temperature 98.1 F (36.7 C), temperature source Oral, resp. rate 18, height 6' (1.829 m), weight 101.3 kg, SpO2 96 %. 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 RHD, Adult nurse.  I discussed with the patient and his wife the risks and benefits of surgery, including the possibility of infection, nerve injury, vessel injury, wound breakdown, arthritis, symptomatic hardware, DVT/ PE, loss of motion, malunion, nonunion, and need for further surgery among others. He acknowledged these risks and wished to proceed.   Altamese Clancy, MD [facility], [facility] [phone]  09/07/2020, 7:54 AM  [facility] [place] [address] [phone] (F)

## 2020-09-08 ENCOUNTER — Encounter (HOSPITAL_COMMUNITY): Payer: Self-pay | Admitting: Orthopedic Surgery

## 2020-09-20 DIAGNOSIS — S42031D Displaced fracture of lateral end of right clavicle, subsequent encounter for fracture with routine healing: Secondary | ICD-10-CM | POA: Diagnosis not present

## 2020-10-18 DIAGNOSIS — S42031D Displaced fracture of lateral end of right clavicle, subsequent encounter for fracture with routine healing: Secondary | ICD-10-CM | POA: Diagnosis not present

## 2020-11-27 DIAGNOSIS — S42031D Displaced fracture of lateral end of right clavicle, subsequent encounter for fracture with routine healing: Secondary | ICD-10-CM | POA: Diagnosis not present

## 2020-12-06 DIAGNOSIS — J3089 Other allergic rhinitis: Secondary | ICD-10-CM | POA: Diagnosis not present

## 2020-12-06 DIAGNOSIS — Z87891 Personal history of nicotine dependence: Secondary | ICD-10-CM | POA: Diagnosis not present

## 2020-12-06 DIAGNOSIS — H1013 Acute atopic conjunctivitis, bilateral: Secondary | ICD-10-CM | POA: Diagnosis not present

## 2020-12-11 DIAGNOSIS — E559 Vitamin D deficiency, unspecified: Secondary | ICD-10-CM | POA: Diagnosis not present

## 2020-12-11 DIAGNOSIS — L600 Ingrowing nail: Secondary | ICD-10-CM | POA: Diagnosis not present

## 2020-12-11 DIAGNOSIS — Z0001 Encounter for general adult medical examination with abnormal findings: Secondary | ICD-10-CM | POA: Diagnosis not present

## 2020-12-12 ENCOUNTER — Other Ambulatory Visit (HOSPITAL_BASED_OUTPATIENT_CLINIC_OR_DEPARTMENT_OTHER): Payer: Self-pay

## 2020-12-12 MED ORDER — CEPHALEXIN 500 MG PO CAPS
ORAL_CAPSULE | ORAL | 0 refills | Status: DC
  Filled 2020-12-12: qty 21, 7d supply, fill #0

## 2020-12-15 DIAGNOSIS — Z8042 Family history of malignant neoplasm of prostate: Secondary | ICD-10-CM | POA: Diagnosis not present

## 2020-12-15 DIAGNOSIS — E559 Vitamin D deficiency, unspecified: Secondary | ICD-10-CM | POA: Diagnosis not present

## 2020-12-15 DIAGNOSIS — Z125 Encounter for screening for malignant neoplasm of prostate: Secondary | ICD-10-CM | POA: Diagnosis not present

## 2020-12-15 DIAGNOSIS — Z0000 Encounter for general adult medical examination without abnormal findings: Secondary | ICD-10-CM | POA: Diagnosis not present

## 2020-12-15 DIAGNOSIS — Z13220 Encounter for screening for lipoid disorders: Secondary | ICD-10-CM | POA: Diagnosis not present

## 2021-02-21 DIAGNOSIS — S42031D Displaced fracture of lateral end of right clavicle, subsequent encounter for fracture with routine healing: Secondary | ICD-10-CM | POA: Diagnosis not present

## 2021-04-04 DIAGNOSIS — Z87891 Personal history of nicotine dependence: Secondary | ICD-10-CM | POA: Diagnosis not present

## 2021-04-04 DIAGNOSIS — J3089 Other allergic rhinitis: Secondary | ICD-10-CM | POA: Diagnosis not present

## 2021-04-11 DIAGNOSIS — J3089 Other allergic rhinitis: Secondary | ICD-10-CM | POA: Diagnosis not present

## 2021-05-08 DIAGNOSIS — L03011 Cellulitis of right finger: Secondary | ICD-10-CM | POA: Diagnosis not present

## 2021-05-11 DIAGNOSIS — M79641 Pain in right hand: Secondary | ICD-10-CM | POA: Diagnosis not present

## 2021-05-11 DIAGNOSIS — L089 Local infection of the skin and subcutaneous tissue, unspecified: Secondary | ICD-10-CM | POA: Diagnosis not present

## 2021-05-11 DIAGNOSIS — L03019 Cellulitis of unspecified finger: Secondary | ICD-10-CM | POA: Insufficient documentation

## 2021-05-14 DIAGNOSIS — L03011 Cellulitis of right finger: Secondary | ICD-10-CM | POA: Diagnosis not present

## 2021-06-08 DIAGNOSIS — J3089 Other allergic rhinitis: Secondary | ICD-10-CM | POA: Diagnosis not present

## 2021-06-13 DIAGNOSIS — J3089 Other allergic rhinitis: Secondary | ICD-10-CM | POA: Diagnosis not present

## 2021-06-20 DIAGNOSIS — J3089 Other allergic rhinitis: Secondary | ICD-10-CM | POA: Diagnosis not present

## 2021-06-26 ENCOUNTER — Other Ambulatory Visit: Payer: Self-pay

## 2021-06-26 ENCOUNTER — Ambulatory Visit (INDEPENDENT_AMBULATORY_CARE_PROVIDER_SITE_OTHER): Payer: 59 | Admitting: Podiatry

## 2021-06-26 ENCOUNTER — Encounter: Payer: Self-pay | Admitting: Podiatry

## 2021-06-26 DIAGNOSIS — L600 Ingrowing nail: Secondary | ICD-10-CM

## 2021-06-26 DIAGNOSIS — J309 Allergic rhinitis, unspecified: Secondary | ICD-10-CM | POA: Insufficient documentation

## 2021-06-26 DIAGNOSIS — J301 Allergic rhinitis due to pollen: Secondary | ICD-10-CM | POA: Insufficient documentation

## 2021-06-26 DIAGNOSIS — J3081 Allergic rhinitis due to animal (cat) (dog) hair and dander: Secondary | ICD-10-CM | POA: Insufficient documentation

## 2021-06-29 NOTE — Progress Notes (Signed)
Subjective:   Patient ID: Juan Allen, male   DOB: 35 y.o.   MRN: [id number]   HPI 35 year old male presents the office today for concerns of ingrown toenails to his left big toe, medial, lateral borders.  Some ongoing for last couple of years.  He tries to trim it himself and it last for some time.  He works in [employer] and he has dropped several things on his toes which has damaged them.  Currently no significant pain, swelling or redness or any drainage.   Review of Systems  All other systems reviewed and are negative.  Past Medical History:  Diagnosis Date   Atypical mole 07/06/2019   mod-left foot - Dr Jerline Pain   BMI 30.0-30.9,adult    Interstitial nephritis 2018    Past Surgical History:  Procedure Laterality Date   ELBOW ARTHROSCOPY Left    medial epidondyle injury   ORIF CLAVICULAR FRACTURE Right 09/07/2020   Procedure: OPEN REDUCTION INTERNAL FIXATION (ORIF) CLAVICULAR FRACTURE;  Surgeon: Altamese [place], MD;  Location: [place];  Service: Orthopedics;  Laterality: Right;   SURGERY SCROTAL / TESTICULAR  2001   scrotal laceration needed 150 stitches   WISDOM TOOTH EXTRACTION       Current Outpatient Medications:    Multiple Vitamins-Minerals (MULTIVITAMIN GUMMIES ADULT PO), Take by mouth., Disp: , Rfl:   Allergies  Allergen Reactions   Nsaids Other (See Comments)    unknown   Dilaudid [Hydromorphone] Hives and Rash          Objective:  Physical Exam  General: AAO x3, NAD  Dermatological: Left hallux toenails hypertrophic, dystrophic with subungual debris.  There is incurvation both medial lateral nail borders.  No significant tenderness there is no edema, erythema or signs of infection.  Vascular: Dorsalis Pedis artery and Posterior Tibial artery pedal pulses are 2/4 bilateral with immedate capillary fill time. There is no pain with calf compression, swelling, warmth, erythema.   Neruologic: Grossly intact via light touch bilateral.   Musculoskeletal:  No gross boney pedal deformities bilateral.  Muscular strength 5/5 in all groups tested bilateral.  Gait: Unassisted, Nonantalgic.       Assessment:   Chronic ingrown toenail without signs of infection left hallux     Plan:  -Treatment options discussed including all alternatives, risks, and complications -Etiology of symptoms were discussed -At this time we discussed partial nail avulsion with chemical matricectomy.  Discussed the procedure and postoperative course.  This point given the ongoing nature I recommend doing this however he wants to hold off on this until after the holidays.  Monitor for any signs or symptoms of infection.  Trula Slade DPM

## 2021-09-24 DIAGNOSIS — Z0000 Encounter for general adult medical examination without abnormal findings: Secondary | ICD-10-CM | POA: Diagnosis not present

## 2021-09-24 DIAGNOSIS — Z125 Encounter for screening for malignant neoplasm of prostate: Secondary | ICD-10-CM | POA: Diagnosis not present

## 2021-09-24 DIAGNOSIS — Z13220 Encounter for screening for lipoid disorders: Secondary | ICD-10-CM | POA: Diagnosis not present

## 2021-12-19 DIAGNOSIS — J3089 Other allergic rhinitis: Secondary | ICD-10-CM | POA: Diagnosis not present

## 2021-12-19 DIAGNOSIS — J028 Acute pharyngitis due to other specified organisms: Secondary | ICD-10-CM | POA: Diagnosis not present

## 2022-03-06 ENCOUNTER — Other Ambulatory Visit: Payer: Self-pay | Admitting: Internal Medicine

## 2022-03-06 DIAGNOSIS — E785 Hyperlipidemia, unspecified: Secondary | ICD-10-CM

## 2022-03-13 DIAGNOSIS — E785 Hyperlipidemia, unspecified: Secondary | ICD-10-CM | POA: Diagnosis not present

## 2022-03-14 LAB — NMR, LIPOPROFILE
Cholesterol, Total: 232 mg/dL — ABNORMAL HIGH (ref 100–199)
HDL Particle Number: 31.9 umol/L (ref >=30.5)
HDL-C: 46 mg/dL (ref >39)
LDL Particle Number: 1747 nmol/L — ABNORMAL HIGH (ref <1000)
LDL Size: 19.8 nm — ABNORMAL LOW (ref >20.5)
LDL-C (NIH Calc): 133 mg/dL — ABNORMAL HIGH (ref 0–99)
LP-IR Score: 65 — ABNORMAL HIGH (ref <=45)
Small LDL Particle Number: 1033 nmol/L — ABNORMAL HIGH (ref <=527)
Triglycerides: 297 mg/dL — ABNORMAL HIGH (ref 0–149)

## 2022-03-14 LAB — LIPOPROTEIN A (LPA): Lipoprotein (a): 234.6 nmol/L — ABNORMAL HIGH (ref <75.0)

## 2022-03-14 LAB — LDL CHOLESTEROL, DIRECT: LDL Direct: 121 mg/dL — ABNORMAL HIGH (ref 0–99)

## 2022-03-19 ENCOUNTER — Encounter (HOSPITAL_BASED_OUTPATIENT_CLINIC_OR_DEPARTMENT_OTHER): Payer: Self-pay | Admitting: Internal Medicine

## 2022-03-19 ENCOUNTER — Other Ambulatory Visit (HOSPITAL_BASED_OUTPATIENT_CLINIC_OR_DEPARTMENT_OTHER): Payer: Self-pay

## 2022-03-19 ENCOUNTER — Ambulatory Visit (INDEPENDENT_AMBULATORY_CARE_PROVIDER_SITE_OTHER): Payer: 59 | Admitting: Internal Medicine

## 2022-03-19 VITALS — BP 129/80 | HR 79 | Ht 72.0 in | Wt 220.0 lb

## 2022-03-19 DIAGNOSIS — E782 Mixed hyperlipidemia: Secondary | ICD-10-CM

## 2022-03-19 DIAGNOSIS — E7841 Elevated Lipoprotein(a): Secondary | ICD-10-CM

## 2022-03-19 DIAGNOSIS — Z79899 Other long term (current) drug therapy: Secondary | ICD-10-CM | POA: Diagnosis not present

## 2022-03-19 DIAGNOSIS — R0602 Shortness of breath: Secondary | ICD-10-CM

## 2022-03-19 MED ORDER — ROSUVASTATIN CALCIUM 5 MG PO TABS
5.0000 mg | ORAL_TABLET | Freq: Every day | ORAL | 3 refills | Status: DC
  Filled 2022-03-19: qty 90, 90d supply, fill #0

## 2022-03-19 MED ORDER — METOPROLOL TARTRATE 50 MG PO TABS
50.0000 mg | ORAL_TABLET | ORAL | 0 refills | Status: DC
  Filled 2022-03-19: qty 1, 1d supply, fill #0

## 2022-03-19 NOTE — Progress Notes (Signed)
LIPID CLINIC CONSULT NOTE  Chief Complaint:  Evaluate and manage cardiovascular risk  Primary Care Physician: Pcp, No  Primary Cardiologist:  None  HPI:  Juan Allen is a 36 y.o. male who is being seen today for the evaluation of cardiovascular risk.  This is a 36 year old male who is self-referred for evaluation management of cardiovascular risk.  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  There is concern for family history of heart disease and a history of high cholesterol.  He had recent lipid testing which I ordered prior to this visit.  This demonstrated an LDL particle #1747, LDL-C of 133, HDL-C of 46 and triglycerides were 297.  It is reported that they were greater than 500 at one time.  The small LDL particle number is elevated at 1033.  Moreover, an LP(a) was checked and is noted to be elevated at 234.6 nmol/L.  He currently does not take any medications.  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  Diet is fairly healthy except occasionally for lunches.  He reports modest alcohol use, primarily on the weekends.  There is a remote history of AAA I believe in a grandparent.  He notes that he has been having some worsening shortness of breath.  This seems to be when walking upstairs or having to do more exertion.  He denies any chest pain.  He apparently had a former smoking history but has quit.  PMHx:  Past Medical History:  Diagnosis Date   Atypical mole 07/06/2019   mod-left foot - Dr Jerline Pain   BMI 30.0-30.9,adult    Interstitial nephritis 2018    Past Surgical History:  Procedure Laterality Date   ELBOW ARTHROSCOPY Left    medial epidondyle injury   ORIF CLAVICULAR FRACTURE Right 09/07/2020   Procedure: OPEN REDUCTION INTERNAL FIXATION (ORIF) CLAVICULAR FRACTURE;  Surgeon: Altamese [place], MD;  Location: [place];  Service: Orthopedics;  Laterality: Right;   SURGERY SCROTAL / TESTICULAR  2001    scrotal laceration needed 150 stitches   WISDOM TOOTH EXTRACTION      FAMHx:  Family History  Problem Relation Age of Onset   Depression Mother    Cancer Maternal Grandfather        bladder cancer   Diabetes Maternal Grandfather    Heart disease Maternal Grandfather    Dementia Paternal Grandmother    Hypertension Paternal Grandfather    Melanoma Maternal Aunt     SOCHx:   reports that he has quit smoking. His smoking use included cigarettes. He has never used smokeless tobacco. He reports current alcohol use of about 6.0 standard drinks of alcohol per week. He reports that he does not use drugs.  ALLERGIES:  Allergies  Allergen Reactions   Nsaids Other (See Comments)    unknown   Dilaudid [Hydromorphone] Hives and Rash    ROS: Pertinent items noted in HPI and remainder of comprehensive ROS otherwise negative.  HOME MEDS: Current Outpatient Medications on File Prior to Visit  Medication Sig Dispense Refill   Multiple Vitamins-Minerals (MULTIVITAMIN GUMMIES ADULT PO) Take by mouth.     No current facility-administered medications on file prior to visit.    LABS/IMAGING: No results found for this or any previous visit (from the past 48 hour(s)). No results found.  LIPID PANEL:    Component Value Date/Time   CHOL 236 (H) 06/30/2019 1022   TRIG 151.0 (  H) 06/30/2019 1022   HDL 53.70 06/30/2019 1022   CHOLHDL 4 06/30/2019 1022   VLDL 30.2 06/30/2019 1022   LDLCALC 152 (H) 06/30/2019 1022   LDLDIRECT 121 (H) 03/13/2022 0848    WEIGHTS: Wt Readings from Last 3 Encounters:  03/19/22 220 lb (99.8 kg)  09/07/20 223 lb 5.2 oz (101.3 kg)  09/05/20 223 lb 4.8 oz (101.3 kg)    VITALS: BP 129/80   Pulse 79   Ht 6' (1.829 m)   Wt 220 lb (99.8 kg)   BMI 29.84 kg/m   EXAM: General appearance: alert and no distress Neck: no carotid bruit, no JVD, and thyroid not enlarged, symmetric, no tenderness/mass/nodules Lungs: clear to auscultation bilaterally Heart:  regular rate and rhythm, S1, S2 normal, no murmur, click, rub or gallop Abdomen: Mildly overweight Extremities: extremities normal, atraumatic, no cyanosis or edema Pulses: 2+ and symmetric Skin: Skin color, texture, turgor normal. No rashes or lesions Neurologic: Grossly normal Psych: Pleasant  EKG: Deferred  ASSESSMENT: Mixed dyslipidemia Elevated LP(a)-234.6 nmol/L Family history of coronary disease History of high triglycerides  PLAN: 1.   [name] has a mixed dyslipidemia with elevated LDL and triglycerides.  I suspect this is a genetic dyslipidemia.  He was also found to have an elevated LP(a) which is significant at 234.6 nmol/L.  This is genetic and has been associated with early onset heart disease.  The LP(a) is not affected by statin therapy however it is recommended to treat with statins to lower risk of heart disease, lower the non-LP(a) LDL and improve his triglycerides.  This also could improve significantly I think with more work on diet, instituting more regular physical activity and decreasing alcohol intake.  We discussed that today.  Based on the fact that we are anticipating improvement with lifestyle modification, I would recommend starting with low-dose rosuvastatin 5 mg daily.  This would be associated with about 30% reduction in LDL cholesterol and hopefully be tolerated.  I am concerned about his shortness of breath with exertion as to whether this could be a symptom of coronary artery disease.  To that end, would recommend CT coronary angiography to evaluate for possible obstructive coronary disease in the setting of high LP(a).  He will likely need 50 mg metoprolol for premedication.  It would be helpful also to evaluate plaque analysis.  Finally, we discussed genetic testing given his high triglycerides and elevated LP(a) as it would be helpful for cascade screening of his children and other family members.  He is agreeable to this and we will send today. Plan follow-up  with repeat lipid NMR in 3-4 months.  Pixie Casino, MD, [facility], [place] Director of the Advanced Lipid Disorders &  Cardiovascular Risk Reduction Clinic Diplomate of the American Board of Clinical Lipidology Attending Cardiologist  Direct Dial: [phone]  Fax: [phone]  Website:  [email or web address]  Nadean Corwin Lindwood Mogel 03/19/2022, 1:05 PM

## 2022-03-19 NOTE — Patient Instructions (Signed)
Medication Instructions:  START rosuvastatin (crestor) '5mg'$  daily  *If you need a refill on your cardiac medications before your next appointment, please call your pharmacy*   Lab Work: Non-Fasting BMET -- about a week prior to CT test   Fasting NMR lipoprofile in about 3 months -- about a week prior to office visit with Dr. Debara Pickett   If you have labs (blood work) drawn today and your tests are completely normal, you will receive your results only by: [place] (if you have MyChart) OR A paper copy in the mail If you have any lab test that is abnormal or we need to change your treatment, we will call you to review the results.   Testing/Procedures: Coronary CTA at [place]: At [facility], you and your health needs are our priority.  As part of our continuing mission to provide you with exceptional heart care, we have created designated Provider Care Teams.  These Care Teams include your primary Cardiologist (physician) and Advanced Practice Providers (APPs -  Physician Assistants and Nurse Practitioners) who all work together to provide you with the care you need, when you need it.  We recommend signing up for the patient portal called "MyChart".  Sign up information is provided on this After Visit Summary.  MyChart is used to connect with patients for Virtual Visits (Telemedicine).  Patients are able to view lab/test results, encounter notes, upcoming appointments, etc.  Non-urgent messages can be sent to your provider as well.   To learn more about what you can do with MyChart, go to [email or web address].    Your next appointment:    3-4 months with Dr. Debara Pickett  Other Instructions   Your cardiac CT will be scheduled at one of the below locations:   [facility] [address] [place], [place] [address] [phone]  [place] [address] [place], [place] [address] [phone]  If scheduled at [facility], please arrive at the [facility] and Children's Entrance (Entrance C2) of [facility] 30 minutes prior to test start time. You can use the FREE valet parking offered at entrance C (encouraged to control the heart rate for the test)  Proceed to the [facility] Radiology Department (first floor) to check-in and test prep.  All radiology patients and guests should use entrance C2 at [facility], accessed from [facility], even though the hospital's physical address listed is [address].    If scheduled at [facility], please arrive 15 mins early for check-in and test prep.  Please follow these instructions carefully (unless otherwise directed):  Hold all erectile dysfunction medications at least 3 days (72 hrs) prior to test.  On the Night Before the Test: Be sure to Drink plenty of water. Do not consume any caffeinated/decaffeinated beverages or chocolate 12 hours prior to your test. Do not take any antihistamines 12 hours prior to your test.  On the Day of the Test: Drink plenty of water until 1 hour prior to the test. Do not eat any food 4 hours prior to the test. You may take your regular medications prior to the test.  Take metoprolol (Lopressor) two hours prior to test.  After the Test: Drink plenty of water. After receiving IV contrast, you may experience a mild flushed feeling. This is normal. On occasion, you may experience a mild rash up to 24 hours after the test.  This is not dangerous. If this occurs, you can take Benadryl 25 mg and increase your fluid intake. If you experience trouble breathing, this can be serious. If it is severe call 911 IMMEDIATELY. If it is mild, please call our office. If you take any of these medications: Glipizide/Metformin, Avandament, Glucavance, please do not take 48 hours after completing test unless otherwise instructed.  We will call to  schedule your test 2-4 weeks out understanding that some insurance companies will need an authorization prior to the service being performed.   For non-scheduling related questions, please contact the cardiac imaging nurse navigator should you have any questions/concerns: Marchia Bond, Cardiac Imaging Nurse Navigator Gordy Clement, Cardiac Imaging Nurse Navigator [place] [facility] Direct Office Dial: [phone]   For scheduling needs, including cancellations and rescheduling, please call [place], [phone].

## 2022-03-27 DIAGNOSIS — E7801 Familial hypercholesterolemia: Secondary | ICD-10-CM | POA: Diagnosis not present

## 2022-04-05 ENCOUNTER — Telehealth (HOSPITAL_COMMUNITY): Payer: Self-pay | Admitting: *Deleted

## 2022-04-05 DIAGNOSIS — Z79899 Other long term (current) drug therapy: Secondary | ICD-10-CM | POA: Diagnosis not present

## 2022-04-05 NOTE — Telephone Encounter (Signed)
Attempted to call patient regarding upcoming cardiac CT appointment. Mailbox full and unable to leave a message.  Gordy Clement RN Navigator Cardiac Imaging [facility] [phone] Office [phone] Cell

## 2022-04-06 LAB — BASIC METABOLIC PANEL
BUN/Creatinine Ratio: 13 (ref 9–20)
BUN: 14 mg/dL (ref 6–20)
CO2: 24 mmol/L (ref 20–29)
Calcium: 9.6 mg/dL (ref 8.7–10.2)
Chloride: 100 mmol/L (ref 96–106)
Creatinine, Ser: 1.11 mg/dL (ref 0.76–1.27)
Glucose: 116 mg/dL — ABNORMAL HIGH (ref 70–99)
Potassium: 4.4 mmol/L (ref 3.5–5.2)
Sodium: 139 mmol/L (ref 134–144)
eGFR: 87 mL/min/{1.73_m2} (ref >59)

## 2022-04-08 ENCOUNTER — Ambulatory Visit (HOSPITAL_COMMUNITY)
Admission: RE | Admit: 2022-04-08 | Discharge: 2022-04-08 | Disposition: A | Discharge status: Home / Self Care | Payer: 59 | Source: Ambulatory Visit | Attending: Internal Medicine | Admitting: Internal Medicine

## 2022-04-08 DIAGNOSIS — R0602 Shortness of breath: Secondary | ICD-10-CM | POA: Insufficient documentation

## 2022-04-08 MED ORDER — NITROGLYCERIN 0.4 MG SL SUBL
0.8000 mg | SUBLINGUAL_TABLET | Freq: Once | SUBLINGUAL | Status: AC
  Administered 2022-04-08: 0.8 mg via SUBLINGUAL

## 2022-04-08 MED ORDER — IOHEXOL 350 MG/ML SOLN
100.0000 mL | Freq: Once | INTRAVENOUS | Status: AC | PRN
  Administered 2022-04-08: 100 mL via INTRAVENOUS

## 2022-04-08 MED ORDER — NITROGLYCERIN 0.4 MG SL SUBL
SUBLINGUAL_TABLET | SUBLINGUAL | Status: AC
  Filled 2022-04-08: qty 2

## 2022-04-19 ENCOUNTER — Encounter (HOSPITAL_BASED_OUTPATIENT_CLINIC_OR_DEPARTMENT_OTHER): Payer: Self-pay | Admitting: Internal Medicine

## 2022-06-29 IMAGING — RF DG C-ARM 1-60 MIN
1 series · 4 of 4 positions shown · non-contrast
Comparison: CT of the right shoulder 09/05/2020.

CLINICAL DATA: Surgery, elective. Additional history provided: Open
reduction internal fixation (ORIF), right clavicular fracture. For
distal right clavicular fracture. Provided

FLUOROSCOPY TIME:  54 seconds (4.63 mGy).
EXAM:
RIGHT CLAVICLE - 2+ VIEWS; DG C-ARM 1-60 MIN

[Series 1: run · 4 of 4 slices shown]
[im 1/4]
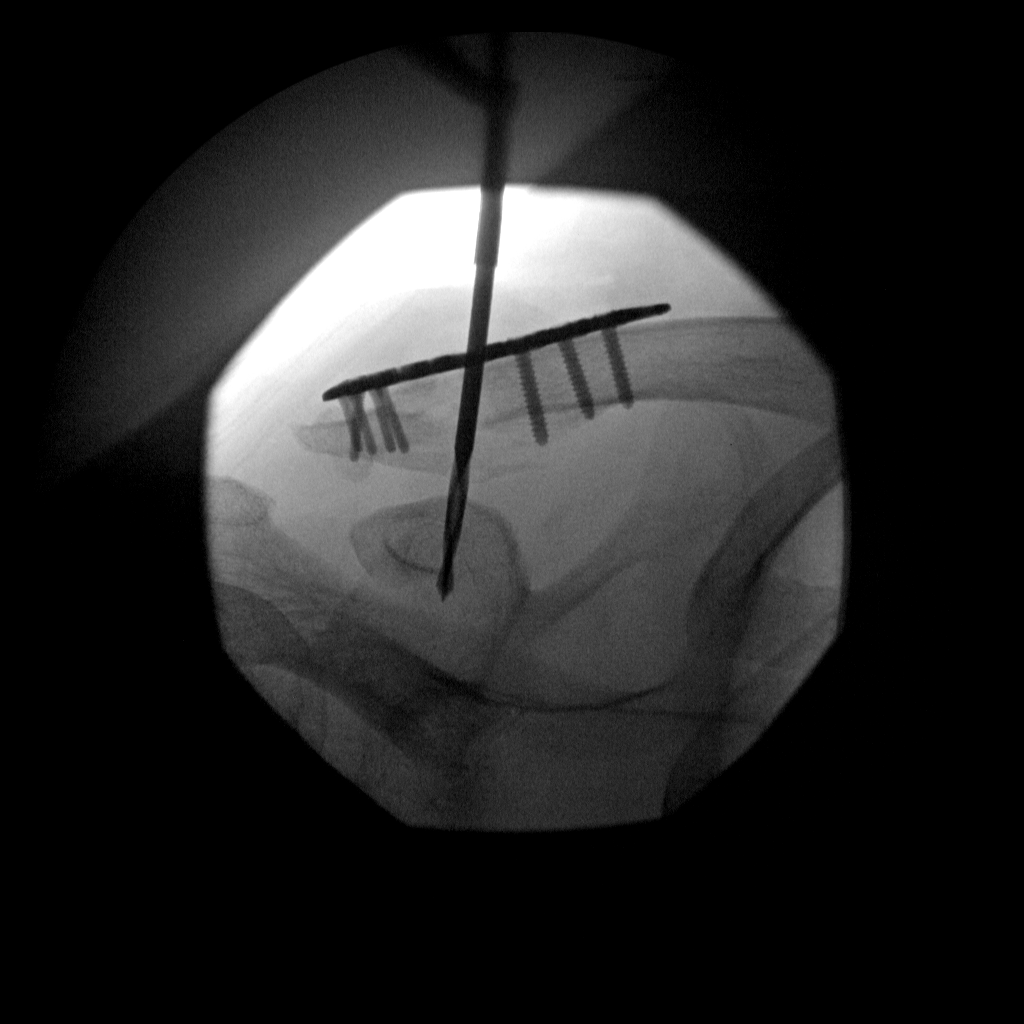
[im 2/4]
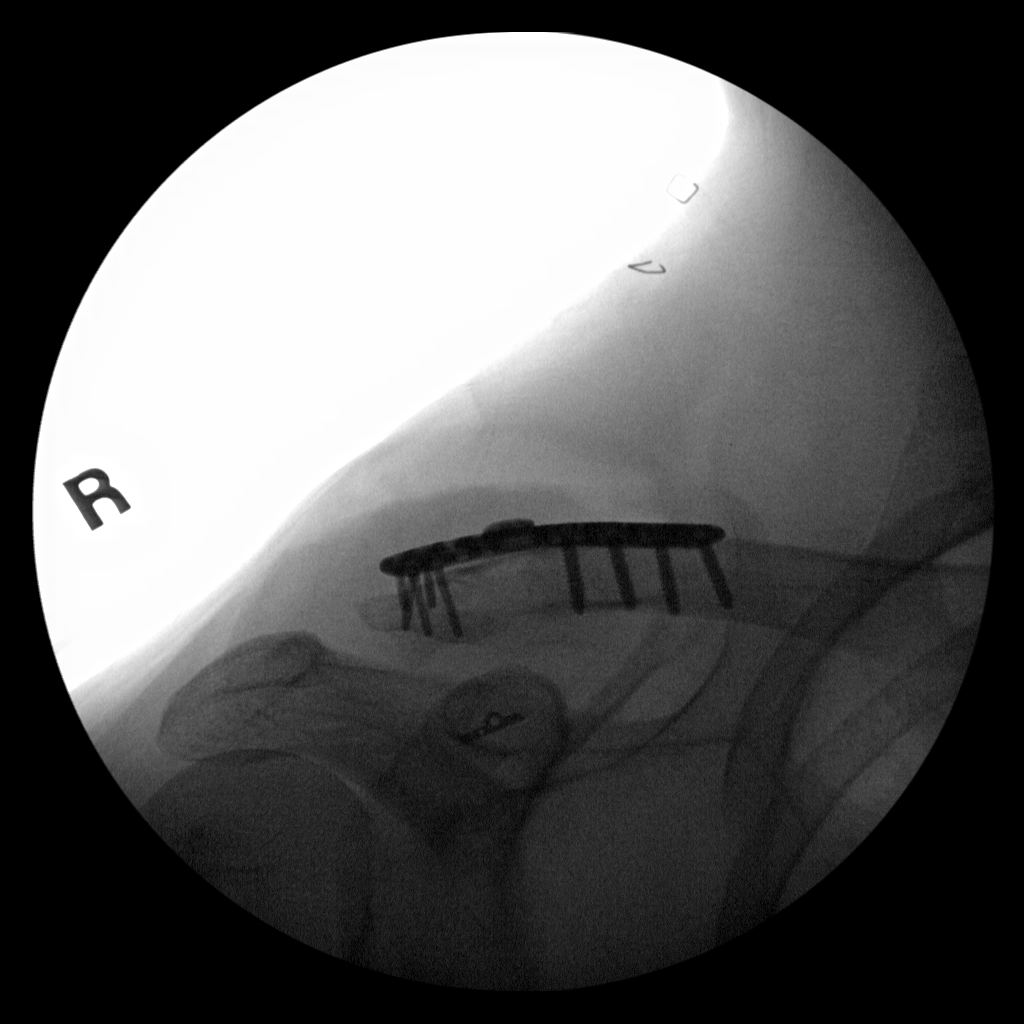
[im 3/4]
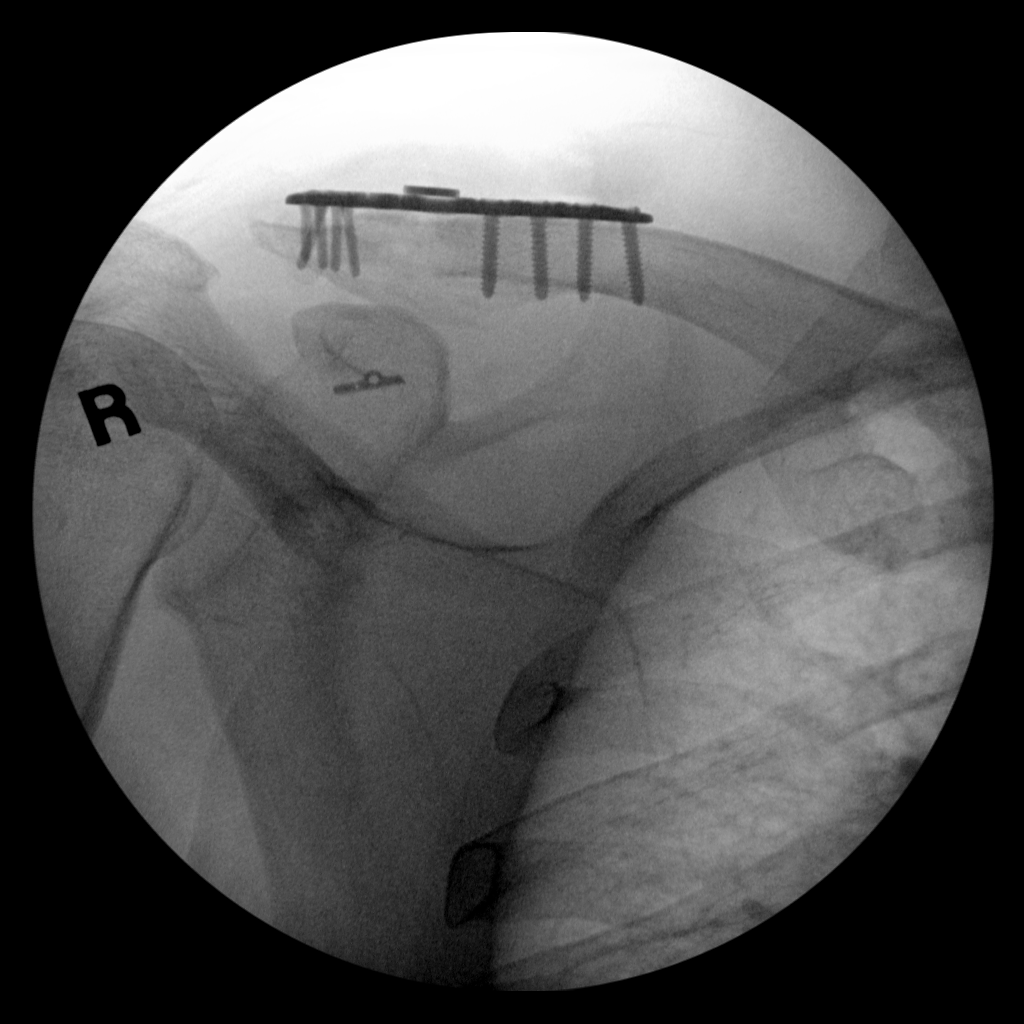
[im 4/4]
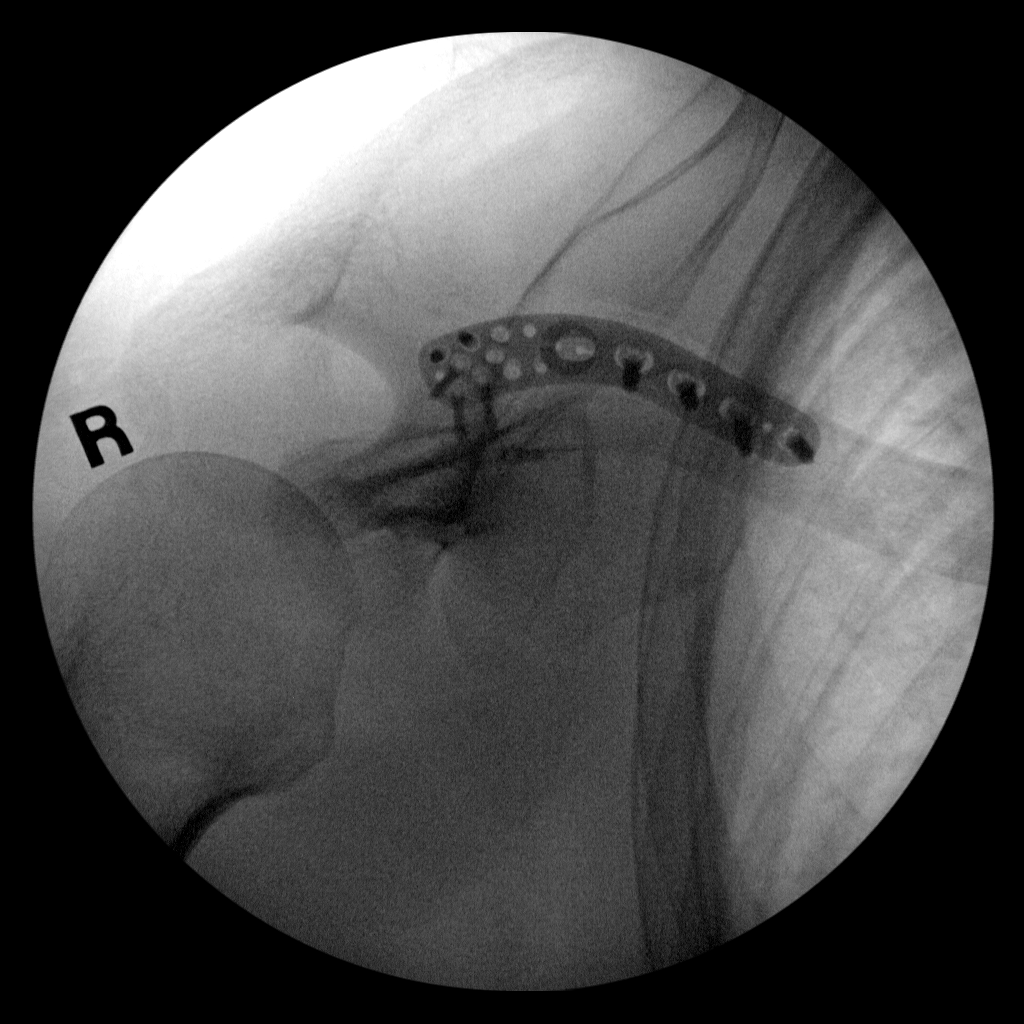

[4 of 4 positions shown; findings below may reference images not displayed]

FINDINGS: Four intraoperative fluoroscopic images of the right clavicle are
submitted. The images demonstrate sequela of interval plate and
screw fixation of a known acute right clavicular fracture. Alignment
at the fracture site appears essentially anatomic.
IMPRESSION: Four intraoperative fluoroscopic images of the right clavicle from
ORIF, as described.

## 2022-06-29 IMAGING — RF DG CLAVICLE*R*
1 series · 4 of 4 positions shown · non-contrast
Comparison: CT of the right shoulder 09/05/2020.

CLINICAL DATA: Surgery, elective. Additional history provided: Open
reduction internal fixation (ORIF), right clavicular fracture. For
distal right clavicular fracture. Provided

FLUOROSCOPY TIME:  54 seconds (4.63 mGy).
EXAM:
RIGHT CLAVICLE - 2+ VIEWS; DG C-ARM 1-60 MIN

[Series 1: run · 4 of 4 slices shown]
[im 1/4]
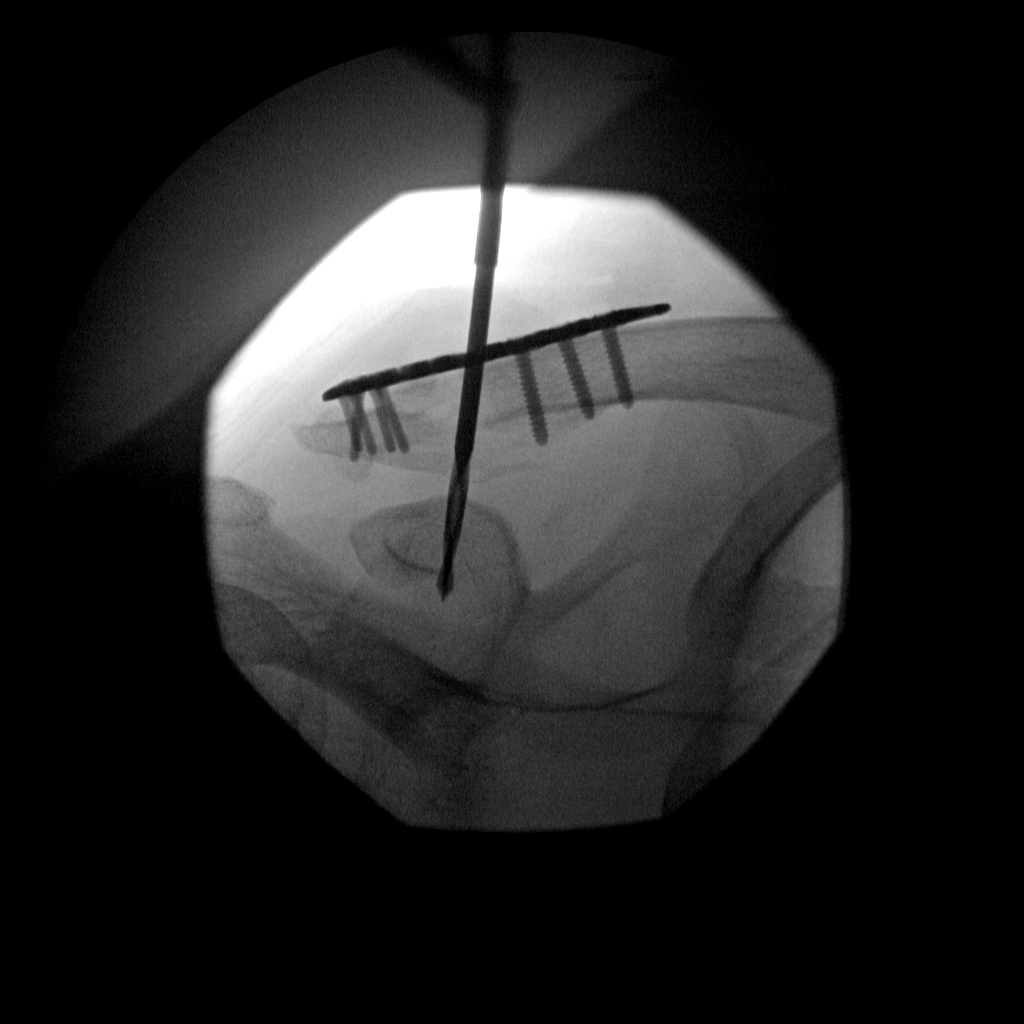
[im 2/4]
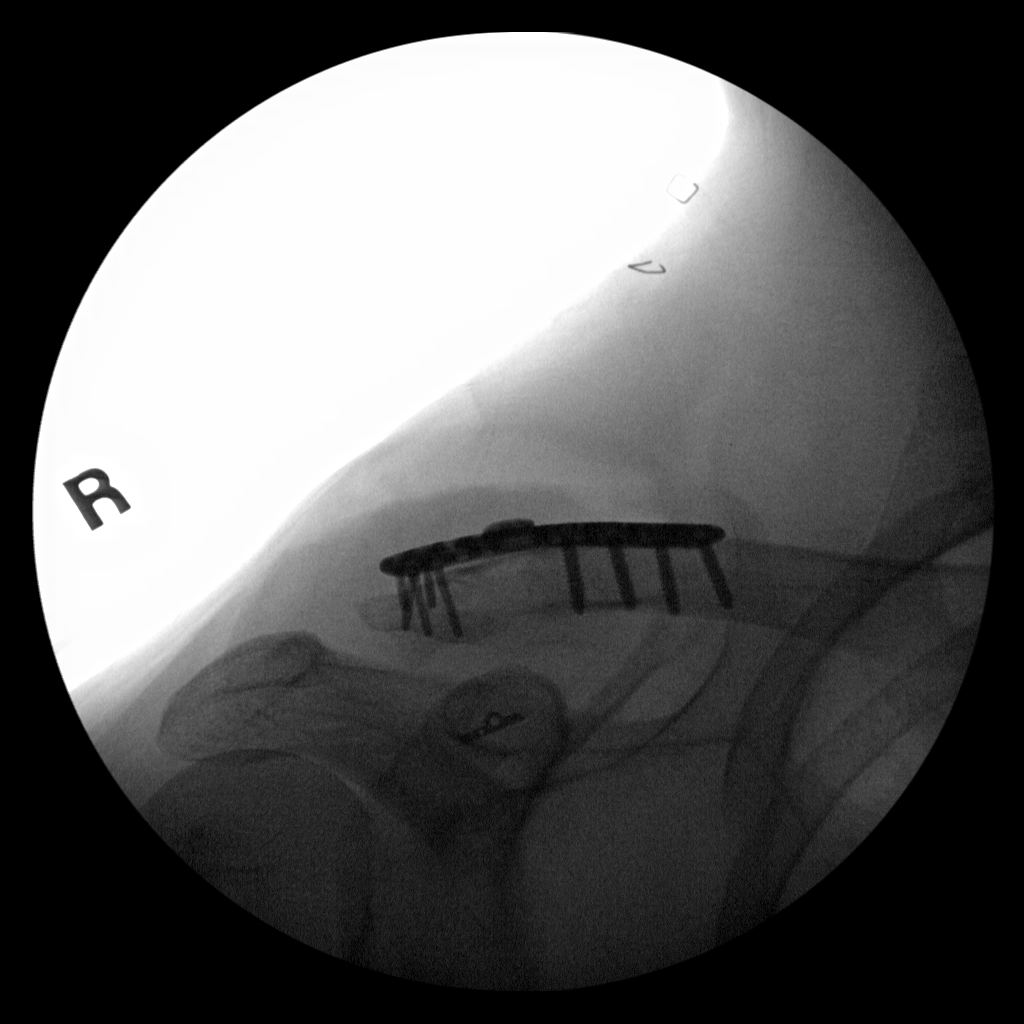
[im 3/4]
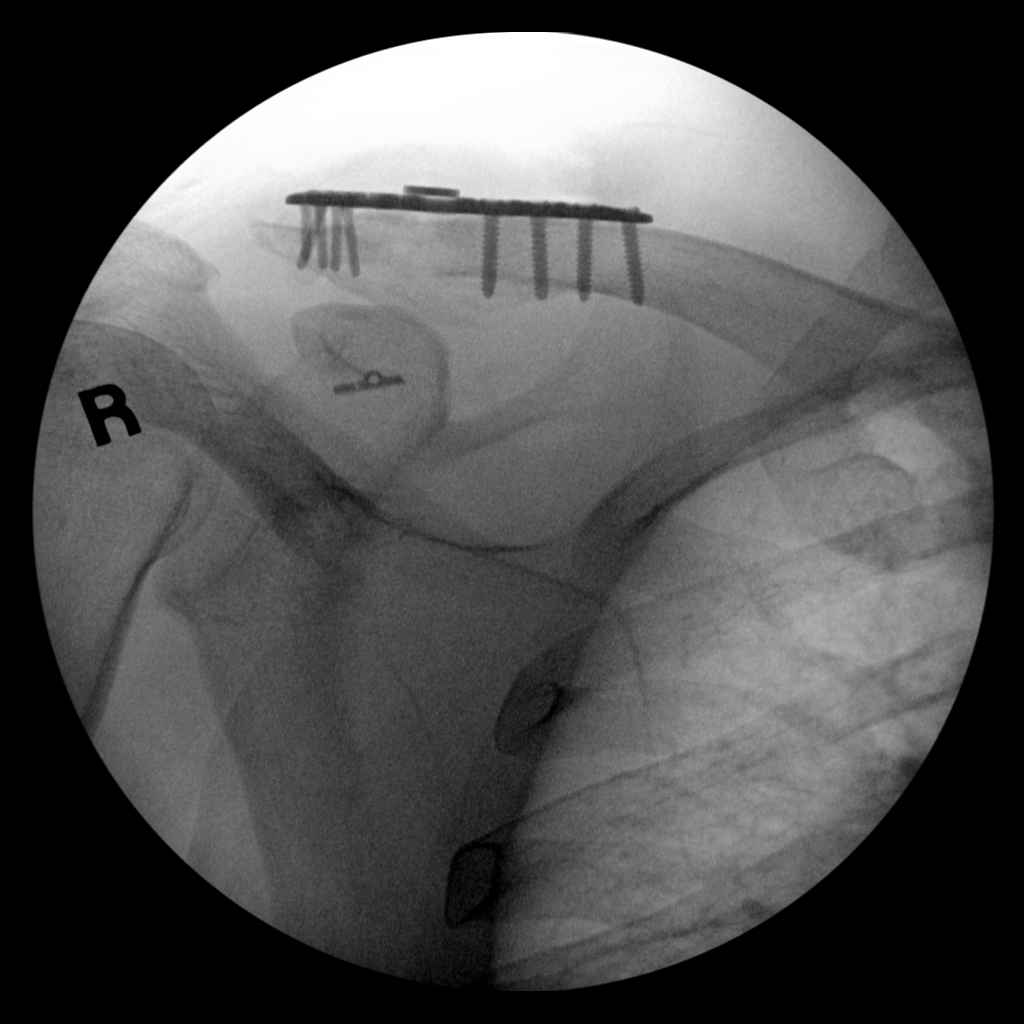
[im 4/4]
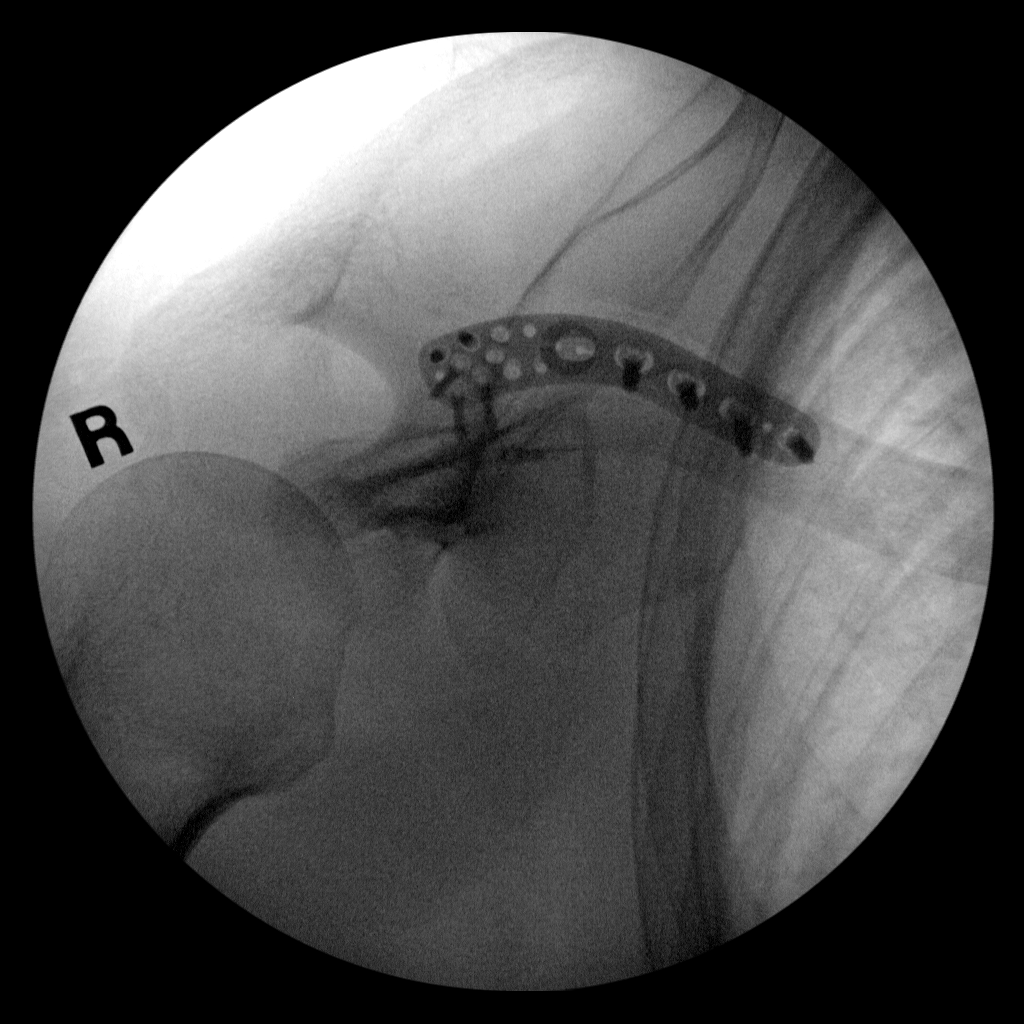

[4 of 4 positions shown; findings below may reference images not displayed]

FINDINGS: Four intraoperative fluoroscopic images of the right clavicle are
submitted. The images demonstrate sequela of interval plate and
screw fixation of a known acute right clavicular fracture. Alignment
at the fracture site appears essentially anatomic.
IMPRESSION: Four intraoperative fluoroscopic images of the right clavicle from
ORIF, as described.

## 2022-07-05 LAB — NMR, LIPOPROFILE
Cholesterol, Total: 203 mg/dL — ABNORMAL HIGH (ref 100–199)
HDL Particle Number: 31.1 umol/L (ref >=30.5)
HDL-C: 35 mg/dL — ABNORMAL LOW (ref >39)
LDL Particle Number: 1344 nmol/L — ABNORMAL HIGH (ref <1000)
LDL Size: 19.7 nm — ABNORMAL LOW (ref >20.5)
LDL-C (NIH Calc): 88 mg/dL (ref 0–99)
LP-IR Score: 72 — ABNORMAL HIGH (ref <=45)
Small LDL Particle Number: 869 nmol/L — ABNORMAL HIGH (ref <=527)
Triglycerides: 487 mg/dL — ABNORMAL HIGH (ref 0–149)

## 2022-07-09 ENCOUNTER — Encounter (HOSPITAL_BASED_OUTPATIENT_CLINIC_OR_DEPARTMENT_OTHER): Payer: Self-pay | Admitting: Internal Medicine

## 2022-07-09 ENCOUNTER — Ambulatory Visit (INDEPENDENT_AMBULATORY_CARE_PROVIDER_SITE_OTHER): Payer: Self-pay | Admitting: Internal Medicine

## 2022-07-09 VITALS — BP 120/84 | HR 67 | Ht 72.0 in | Wt 224.0 lb

## 2022-07-09 DIAGNOSIS — E782 Mixed hyperlipidemia: Secondary | ICD-10-CM

## 2022-07-09 DIAGNOSIS — E7841 Elevated Lipoprotein(a): Secondary | ICD-10-CM

## 2022-07-09 NOTE — Progress Notes (Signed)
LIPID CLINIC CONSULT NOTE  Chief Complaint:  Follow-up dyslipidemia  Primary Care Physician: Eunice Blase, MD  Primary Cardiologist:  None  HPI:  Juan Allen is a 36 y.o. male who is being seen today for the evaluation of cardiovascular risk.  This is a 36 year old male who is self-referred for evaluation management of cardiovascular risk.  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  There is concern for family history of heart disease and a history of high cholesterol.  He had recent lipid testing which I ordered prior to this visit.  This demonstrated an LDL particle #1747, LDL-C of 133, HDL-C of 46 and triglycerides were 297.  It is reported that they were greater than 500 at one time.  The small LDL particle number is elevated at 1033.  Moreover, an LP(a) was checked and is noted to be elevated at 234.6 nmol/L.  He currently does not take any medications.  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  Diet is fairly healthy except occasionally for lunches.  He reports modest alcohol use, primarily on the weekends.  There is a remote history of AAA I believe in a grandparent.  He notes that he has been having some worsening shortness of breath.  This seems to be when walking upstairs or having to do more exertion.  He denies any chest pain.  He apparently had a former smoking history but has quit.  07/09/2022  Juan Allen returns today for follow-up.  He did undergo CT coronary angiography which fortunately showed no coronary calcium and very minimal LAD plaque which was noncalcified-total plaque volume was 13 mm.  Overall this is considered a low risk study.  No extracardiac findings were noted.  He has successfully started on 5 mg rosuvastatin.  He has had a significant response to this.  His LDL particle number is down from 1747-1344.  LDL-C has come down from 133-88.  He is HDL is unfortunately lower at 35 with a rise in  triglycerides from 297-487.  His small LDL particle numbers are lower as well.  He was found to have an elevated LP(a) at 234.6 nmol/L.  We did discuss the nature of this and the fact that the statin would not lower the LP(a).  We also talked about other options for lipid-lowering.  He reports he does not regularly exercise and this may be related to his high triglycerides along with diet.  PMHx:  Past Medical History:  Diagnosis Date   Atypical mole 07/06/2019   mod-left foot - Dr Jerline Pain   BMI 30.0-30.9,adult    Interstitial nephritis 2018    Past Surgical History:  Procedure Laterality Date   ELBOW ARTHROSCOPY Left    medial epidondyle injury   ORIF CLAVICULAR FRACTURE Right 09/07/2020   Procedure: OPEN REDUCTION INTERNAL FIXATION (ORIF) CLAVICULAR FRACTURE;  Surgeon: Altamese Eakly, MD;  Location: [place];  Service: Orthopedics;  Laterality: Right;   SURGERY SCROTAL / TESTICULAR  2001   scrotal laceration needed 150 stitches   WISDOM TOOTH EXTRACTION      FAMHx:  Family History  Problem Relation Age of Onset   Depression Mother    Cancer Maternal Grandfather        bladder cancer   Diabetes Maternal Grandfather    Heart disease Maternal Grandfather    Dementia Paternal Grandmother    Hypertension Paternal Grandfather    Melanoma Maternal Aunt  SOCHx:   reports that he has quit smoking. His smoking use included cigarettes. He has never used smokeless tobacco. He reports current alcohol use of about 6.0 standard drinks of alcohol per week. He reports that he does not use drugs.  ALLERGIES:  Allergies  Allergen Reactions   Nsaids Other (See Comments)    unknown   Dilaudid [Hydromorphone] Hives and Rash    ROS: Pertinent items noted in HPI and remainder of comprehensive ROS otherwise negative.  HOME MEDS: Current Outpatient Medications on File Prior to Visit  Medication Sig Dispense Refill   Multiple Vitamins-Minerals (MULTIVITAMIN GUMMIES ADULT PO) Take by mouth.      rosuvastatin (CRESTOR) 5 MG tablet Take 1 tablet (5 mg total) by mouth daily. 90 tablet 3   metoprolol tartrate (LOPRESSOR) 50 MG tablet Take 1 tablet (50 mg total) by mouth TWO HOURS prior to CT test 1 tablet 0   No current facility-administered medications on file prior to visit.    LABS/IMAGING: No results found for this or any previous visit (from the past 48 hour(s)). No results found.  LIPID PANEL:    Component Value Date/Time   CHOL 236 (H) 06/30/2019 1022   TRIG 151.0 (H) 06/30/2019 1022   HDL 53.70 06/30/2019 1022   CHOLHDL 4 06/30/2019 1022   VLDL 30.2 06/30/2019 1022   LDLCALC 152 (H) 06/30/2019 1022   LDLDIRECT 121 (H) 03/13/2022 0848    WEIGHTS: Wt Readings from Last 3 Encounters:  07/09/22 224 lb (101.6 kg)  03/19/22 220 lb (99.8 kg)  09/07/20 223 lb 5.2 oz (101.3 kg)    VITALS: BP 120/84   Pulse 67   Ht 6' (1.829 m)   Wt 224 lb (101.6 kg)   BMI 30.38 kg/m   EXAM: Deferred  EKG: Deferred  ASSESSMENT: Mixed dyslipidemia Elevated LP(a)-234.6 nmol/L Family history of coronary disease History of high triglycerides  PLAN: 1.   [name] has had a significant improvement in his lipids on low-dose rosuvastatin.  He seems to be tolerating this well.  LDL remains still above target less than 70 but more concerning his triglycerides have gone up from 297-487.  HDL is lower as well.  I think this is a factor of less exercise and diet.  He does have an elevated LP(a) which has not affected by the statin and we discussed the possibility of PCSK9 inhibitor therapy.  This may be difficult to get approved because he has no coronary disease that was noted on CT.  Nonetheless, I think a more aggressive diet would be very helpful and more regular activity.  He reports that he is willing to work hard at this and would like to repeat his lipids in about 3 months.  At that point, we could consider additional therapy if his triglycerides remain elevated.  Pixie Casino,  MD, [facility], [place] Director of the Advanced Lipid Disorders &  Cardiovascular Risk Reduction Clinic Diplomate of the American Board of Clinical Lipidology Attending Cardiologist  Direct Dial: [phone]  Fax: [phone]  Website:  [email or web address] 07/09/2022, 2:07 PM

## 2022-07-09 NOTE — Patient Instructions (Signed)
Medication Instructions:  NO CHANGES  *If you need a refill on your cardiac medications before your next appointment, please call your pharmacy*   Lab Work: FASTING lab work to check cholesterol in 3-4 months   If you have labs (blood work) drawn today and your tests are completely normal, you will receive your results only by: [place] (if you have MyChart) OR A paper copy in the mail If you have any lab test that is abnormal or we need to change your treatment, we will call you to review the results.   Follow-Up: At [facility], you and your health needs are our priority.  As part of our continuing mission to provide you with exceptional heart care, we have created designated Provider Care Teams.  These Care Teams include your primary Cardiologist (physician) and Advanced Practice Providers (APPs -  Physician Assistants and Nurse Practitioners) who all work together to provide you with the care you need, when you need it.  We recommend signing up for the patient portal called "MyChart".  Sign up information is provided on this After Visit Summary.  MyChart is used to connect with patients for Virtual Visits (Telemedicine).  Patients are able to view lab/test results, encounter notes, upcoming appointments, etc.  Non-urgent messages can be sent to your provider as well.   To learn more about what you can do with MyChart, go to [email or web address].    Your next appointment:    3-4 months with Dr. Debara Pickett

## 2022-11-18 ENCOUNTER — Ambulatory Visit (HOSPITAL_BASED_OUTPATIENT_CLINIC_OR_DEPARTMENT_OTHER): Payer: Self-pay | Admitting: Internal Medicine

## 2022-11-29 ENCOUNTER — Other Ambulatory Visit (HOSPITAL_BASED_OUTPATIENT_CLINIC_OR_DEPARTMENT_OTHER): Payer: Self-pay

## 2022-11-29 MED ORDER — ROSUVASTATIN CALCIUM 5 MG PO TABS
5.0000 mg | ORAL_TABLET | Freq: Every day | ORAL | 5 refills | Status: DC
  Filled 2022-11-29: qty 30, 30d supply, fill #0

## 2022-12-19 ENCOUNTER — Other Ambulatory Visit: Payer: Self-pay

## 2022-12-19 MED ORDER — ROSUVASTATIN CALCIUM 5 MG PO TABS: 5.0000 mg | ORAL_TABLET | Freq: Every day | ORAL | 1 refills | Status: DC

## 2023-01-24 ENCOUNTER — Ambulatory Visit: Payer: Self-pay | Admitting: Internal Medicine

## 2023-04-25 ENCOUNTER — Other Ambulatory Visit (HOSPITAL_BASED_OUTPATIENT_CLINIC_OR_DEPARTMENT_OTHER): Payer: Self-pay

## 2023-04-25 MED ORDER — ALBENDAZOLE 200 MG PO TABS
400.0000 mg | ORAL_TABLET | ORAL | 3 refills | Status: AC
  Filled 2023-04-25: qty 4, 14d supply, fill #0

## 2023-04-28 ENCOUNTER — Other Ambulatory Visit (HOSPITAL_BASED_OUTPATIENT_CLINIC_OR_DEPARTMENT_OTHER): Payer: Self-pay

## 2023-05-08 ENCOUNTER — Other Ambulatory Visit (HOSPITAL_BASED_OUTPATIENT_CLINIC_OR_DEPARTMENT_OTHER): Payer: Self-pay

## 2023-05-16 ENCOUNTER — Other Ambulatory Visit: Payer: Self-pay | Admitting: Internal Medicine

## 2023-05-16 DIAGNOSIS — E785 Hyperlipidemia, unspecified: Secondary | ICD-10-CM

## 2023-05-16 DIAGNOSIS — E7841 Elevated Lipoprotein(a): Secondary | ICD-10-CM

## 2023-05-16 DIAGNOSIS — E782 Mixed hyperlipidemia: Secondary | ICD-10-CM

## 2023-05-16 DIAGNOSIS — Z79899 Other long term (current) drug therapy: Secondary | ICD-10-CM

## 2023-05-30 ENCOUNTER — Encounter: Payer: Self-pay | Admitting: Internal Medicine

## 2023-07-31 LAB — LAB REPORT - SCANNED
A1c: 5.6
EGFR: 103
Free T4: 1.4 ng/dL
TSH: 1.01 (ref 0.41–5.90)

## 2023-09-08 ENCOUNTER — Other Ambulatory Visit (HOSPITAL_BASED_OUTPATIENT_CLINIC_OR_DEPARTMENT_OTHER): Payer: Self-pay

## 2023-10-07 ENCOUNTER — Ambulatory Visit (HOSPITAL_BASED_OUTPATIENT_CLINIC_OR_DEPARTMENT_OTHER): Payer: Self-pay | Admitting: Internal Medicine

## 2023-10-07 ENCOUNTER — Encounter (HOSPITAL_BASED_OUTPATIENT_CLINIC_OR_DEPARTMENT_OTHER): Payer: Self-pay | Admitting: Internal Medicine

## 2023-10-07 ENCOUNTER — Other Ambulatory Visit (HOSPITAL_BASED_OUTPATIENT_CLINIC_OR_DEPARTMENT_OTHER): Payer: Self-pay

## 2023-10-07 VITALS — BP 112/68 | HR 75 | Ht 72.0 in | Wt 202.5 lb

## 2023-10-07 DIAGNOSIS — E7841 Elevated Lipoprotein(a): Secondary | ICD-10-CM | POA: Diagnosis not present

## 2023-10-07 DIAGNOSIS — E785 Hyperlipidemia, unspecified: Secondary | ICD-10-CM | POA: Diagnosis not present

## 2023-10-07 MED ORDER — ROSUVASTATIN CALCIUM 5 MG PO TABS
5.0000 mg | ORAL_TABLET | Freq: Every day | ORAL | 3 refills | Status: AC
  Filled 2023-10-07: qty 90, 90d supply, fill #0
  Filled 2024-02-04: qty 90, 90d supply, fill #1
  Filled 2024-05-04: qty 90, 90d supply, fill #2
  Filled 2024-08-02: qty 90, 90d supply, fill #3

## 2023-10-07 NOTE — Progress Notes (Signed)
 LIPID CLINIC CONSULT NOTE  Chief Complaint:  Follow-up dyslipidemia  Primary Care Physician: Lavada Mesi, MD  Primary Cardiologist:  None  HPI:  Juan Allen is a 38 y.o. male who is being seen today for the evaluation of cardiovascular risk.  This is a 38 year old male who is self-referred for evaluation management of cardiovascular risk.  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  There is concern for family history of heart disease and a history of high cholesterol.  He had recent lipid testing which I ordered prior to this visit.  This demonstrated an LDL particle #1747, LDL-C of 133, HDL-C of 46 and triglycerides were 297.  It is reported that they were greater than 500 at one time.  The small LDL particle number is elevated at 1033.  Moreover, an LP(a) was checked and is noted to be elevated at 234.6 nmol/L.  He currently does not take any medications.  He says he is very active and works in [employer] but does not exercise regularly.  Diet is fairly healthy except occasionally for lunches.  He reports modest alcohol use, primarily on the weekends.  There is a remote history of AAA I believe in a grandparent.  He notes that he has been having some worsening shortness of breath.  This seems to be when walking upstairs or having to do more exertion.  He denies any chest pain.  He apparently had a former smoking history but has quit.  07/09/2022  Juan Allen returns today for follow-up.  He did undergo CT coronary angiography which fortunately showed no coronary calcium and very minimal LAD plaque which was noncalcified-total plaque volume was 13 mm.  Overall this is considered a low risk study.  No extracardiac findings were noted.  He has successfully started on 5 mg rosuvastatin.  He has had a significant response to this.  His LDL particle number is down from 1747-1344.  LDL-C has come down from 133-88.  He is HDL is unfortunately lower at 35 with a rise in  triglycerides from 297-487.  His small LDL particle numbers are lower as well.  He was found to have an elevated LP(a) at 234.6 nmol/L.  We did discuss the nature of this and the fact that the statin would not lower the LP(a).  We also talked about other options for lipid-lowering.  He reports he does not regularly exercise and this may be related to his high triglycerides along with diet.  10/07/2023  Juan Allen is seen today in follow-up.  He seems to be doing very well.  He is lost about 20 pounds and is cut out alcohol and is exercising more regularly.  Very pleased to see the progress that is made.  His cholesterol has improved substantially.  He had lab work drawn recently which showed total cholesterol 153, triglycerides 78, HDL 49 and LDL 89 as of September 30, 2023.  LP(a) is elevated as noted previously at 224.9 nmol/L.  Otherwise his liver enzymes are normal.  He is C-reactive protein was 0.39 which is normal.  Overall he feels well and remains asymptomatic.  PMHx:  Past Medical History:  Diagnosis Date   Atypical mole 07/06/2019   mod-left foot - Dr Jimmey Ralph   BMI 30.0-30.9,adult    Interstitial nephritis 2018    Past Surgical History:  Procedure Laterality Date   ELBOW ARTHROSCOPY Left    medial epidondyle injury   ORIF CLAVICULAR FRACTURE Right 09/07/2020  Procedure: OPEN REDUCTION INTERNAL FIXATION (ORIF) CLAVICULAR FRACTURE;  Surgeon: Myrene Galas, MD;  Location: [facility];  Service: Orthopedics;  Laterality: Right;   SURGERY SCROTAL / TESTICULAR  2001   scrotal laceration needed 150 stitches   WISDOM TOOTH EXTRACTION      FAMHx:  Family History  Problem Relation Age of Onset   Depression Mother    Cancer Maternal Grandfather        bladder cancer   Diabetes Maternal Grandfather    Heart disease Maternal Grandfather    Dementia Paternal Grandmother    Hypertension Paternal Grandfather    Melanoma Maternal Aunt     SOCHx:   reports that he has quit smoking. His smoking use  included cigarettes. He has never used smokeless tobacco. He reports current alcohol use of about 6.0 standard drinks of alcohol per week. He reports that he does not use drugs.  ALLERGIES:  Allergies  Allergen Reactions   Nsaids Other (See Comments)    unknown   Dilaudid [Hydromorphone] Hives and Rash    ROS: Pertinent items noted in HPI and remainder of comprehensive ROS otherwise negative.  HOME MEDS: Current Outpatient Medications on File Prior to Visit  Medication Sig Dispense Refill   albendazole (ALBENZA) 200 MG tablet Take 2 tablets (400 mg total) by mouth now. Repeat dose in 2 weeks. 4 tablet 3   Multiple Vitamins-Minerals (MULTIVITAMIN GUMMIES ADULT PO) Take by mouth.     rosuvastatin (CRESTOR) 5 MG tablet Take 1 tablet (5 mg total) by mouth daily. 90 tablet 1   metoprolol tartrate (LOPRESSOR) 50 MG tablet Take 1 tablet (50 mg total) by mouth TWO HOURS prior to CT test 1 tablet 0   No current facility-administered medications on file prior to visit.    LABS/IMAGING: No results found for this or any previous visit (from the past 48 hours). No results found.  LIPID PANEL:    Component Value Date/Time   CHOL 236 (H) 06/30/2019 1022   TRIG 151.0 (H) 06/30/2019 1022   HDL 53.70 06/30/2019 1022   CHOLHDL 4 06/30/2019 1022   VLDL 30.2 06/30/2019 1022   LDLCALC 152 (H) 06/30/2019 1022   LDLDIRECT 121 (H) 03/13/2022 0848    WEIGHTS: Wt Readings from Last 3 Encounters:  10/07/23 202 lb 8 oz (91.9 kg)  07/09/22 224 lb (101.6 kg)  03/19/22 220 lb (99.8 kg)    VITALS: BP 112/68   Pulse 75   Ht 6' (1.829 m)   Wt 202 lb 8 oz (91.9 kg)   SpO2 97%   BMI 27.46 kg/m   EXAM: Deferred  EKG: Deferred  ASSESSMENT: Mixed dyslipidemia Elevated LP(a)-234.6 nmol/L Family history of coronary disease History of high triglycerides 0 CAC score with minimal proximal LAD plaque (13 mm) -03/2022  PLAN: 1.   Juan Allen continues to have had a marked improvement in his lipids.   He is recently lost about 20 pounds and seems to be doing very well.  He remains on low-dose rosuvastatin.  For the time being I would continue on that.  His target LDL is less than 100 and he is managed to achieve that at this point.  We discussed clinical trials that are ongoing regarding LP(a) and we should know more data about those therapies in the next several years.  At this point although his LP(a) is high enough to potentially qualify for clinical trial given the lack of coronary calcification or significant risk of heart disease he would not qualify for the  study that we are performing.  Follow-up with me annually or sooner if necessary.  Chrystie Nose, MD, [facility], FACP  [place]  [facility]  Medical Director of the Advanced Lipid Disorders &  Cardiovascular Risk Reduction Clinic Diplomate of the American Board of Clinical Lipidology Attending Cardiologist  Direct Dial: [phone]  Fax: [phone]  Website:  [email or web address] 10/07/2023, 11:25 AM

## 2023-10-07 NOTE — Patient Instructions (Signed)
 Medication Instructions:  NO CHANGES  *If you need a refill on your cardiac medications before your next appointment, please call your pharmacy*   Follow-Up: At [facility], you and your health needs are our priority.  As part of our continuing mission to provide you with exceptional heart care, we have created designated Provider Care Teams.  These Care Teams include your primary Cardiologist (physician) and Advanced Practice Providers (APPs -  Physician Assistants and Nurse Practitioners) who all work together to provide you with the care you need, when you need it.  We recommend signing up for the patient portal called "MyChart".  Sign up information is provided on this After Visit Summary.  MyChart is used to connect with patients for Virtual Visits (Telemedicine).  Patients are able to view lab/test results, encounter notes, upcoming appointments, etc.  Non-urgent messages can be sent to your provider as well.   To learn more about what you can do with MyChart, go to [email or web address].    Your next appointment:    12 months with Dr. Rennis Golden

## 2023-11-25 ENCOUNTER — Other Ambulatory Visit: Payer: Self-pay

## 2024-03-04 DIAGNOSIS — L720 Epidermal cyst: Secondary | ICD-10-CM | POA: Diagnosis not present

## 2024-08-03 ENCOUNTER — Other Ambulatory Visit (HOSPITAL_BASED_OUTPATIENT_CLINIC_OR_DEPARTMENT_OTHER): Payer: Self-pay

## 2024-08-03 ENCOUNTER — Encounter (HOSPITAL_BASED_OUTPATIENT_CLINIC_OR_DEPARTMENT_OTHER): Payer: Self-pay
